# Patient Record
Sex: Female | Born: 1950 | Race: White | Hispanic: No | Marital: Married | Smoking: Never smoker
Health system: Southern US, Community
[De-identification: ages and names within clinical notes are randomized; demographics above are authoritative.]

## PROBLEM LIST (undated history)

## (undated) DIAGNOSIS — Z8701 Personal history of pneumonia (recurrent): Secondary | ICD-10-CM

## (undated) DIAGNOSIS — E119 Type 2 diabetes mellitus without complications: Secondary | ICD-10-CM

## (undated) DIAGNOSIS — Z87440 Personal history of urinary (tract) infections: Secondary | ICD-10-CM

## (undated) DIAGNOSIS — R933 Abnormal findings on diagnostic imaging of other parts of digestive tract: Secondary | ICD-10-CM

## (undated) DIAGNOSIS — Z0100 Encounter for examination of eyes and vision without abnormal findings: Secondary | ICD-10-CM

## (undated) DIAGNOSIS — Z789 Other specified health status: Secondary | ICD-10-CM

## (undated) DIAGNOSIS — Z9289 Personal history of other medical treatment: Secondary | ICD-10-CM

## (undated) DIAGNOSIS — T7840XA Allergy, unspecified, initial encounter: Secondary | ICD-10-CM

## (undated) DIAGNOSIS — I10 Essential (primary) hypertension: Secondary | ICD-10-CM

## (undated) HISTORY — DX: Personal history of urinary (tract) infections: Z87.440

## (undated) HISTORY — DX: Essential (primary) hypertension: I10

## (undated) HISTORY — DX: Personal history of other medical treatment: Z92.89

## (undated) HISTORY — DX: Allergy, unspecified, initial encounter: T78.40XA

## (undated) HISTORY — DX: Other specified health status: Z78.9

## (undated) HISTORY — DX: Encounter for examination of eyes and vision without abnormal findings: Z01.00

## (undated) HISTORY — DX: Abnormal findings on diagnostic imaging of other parts of digestive tract: R93.3

## (undated) HISTORY — DX: Personal history of pneumonia (recurrent): Z87.01

## (undated) HISTORY — DX: Type 2 diabetes mellitus without complications: E11.9

---

## 1997-09-21 ENCOUNTER — Other Ambulatory Visit: Admission: RE | Admit: 1997-09-21 | Discharge: 1997-09-21 | Payer: Self-pay | Admitting: Obstetrics & Gynecology

## 1998-09-02 ENCOUNTER — Other Ambulatory Visit: Admission: RE | Admit: 1998-09-02 | Discharge: 1998-09-02 | Payer: Self-pay | Admitting: Obstetrics & Gynecology

## 1999-02-25 ENCOUNTER — Other Ambulatory Visit: Admission: RE | Admit: 1999-02-25 | Discharge: 1999-02-25 | Payer: Self-pay | Admitting: Obstetrics and Gynecology

## 2000-02-21 ENCOUNTER — Other Ambulatory Visit: Admission: RE | Admit: 2000-02-21 | Discharge: 2000-02-21 | Payer: Self-pay | Admitting: Obstetrics and Gynecology

## 2000-05-31 ENCOUNTER — Ambulatory Visit (HOSPITAL_BASED_OUTPATIENT_CLINIC_OR_DEPARTMENT_OTHER): Admission: RE | Admit: 2000-05-31 | Discharge: 2000-05-31 | Payer: Self-pay | Admitting: Orthopedic Surgery

## 2000-08-03 ENCOUNTER — Ambulatory Visit (HOSPITAL_BASED_OUTPATIENT_CLINIC_OR_DEPARTMENT_OTHER): Admission: RE | Admit: 2000-08-03 | Discharge: 2000-08-03 | Payer: Self-pay | Admitting: Orthopedic Surgery

## 2002-06-12 HISTORY — PX: OTHER SURGICAL HISTORY: SHX169

## 2012-06-12 HISTORY — PX: BUNIONECTOMY: SHX129

## 2014-09-16 HISTORY — PX: ESOPHAGOGASTRODUODENOSCOPY: SHX1529

## 2015-02-22 DIAGNOSIS — R933 Abnormal findings on diagnostic imaging of other parts of digestive tract: Secondary | ICD-10-CM

## 2015-02-22 HISTORY — PX: COLONOSCOPY: SHX174

## 2015-02-22 HISTORY — DX: Abnormal findings on diagnostic imaging of other parts of digestive tract: R93.3

## 2015-10-10 DIAGNOSIS — E119 Type 2 diabetes mellitus without complications: Secondary | ICD-10-CM

## 2015-10-10 DIAGNOSIS — Z0100 Encounter for examination of eyes and vision without abnormal findings: Secondary | ICD-10-CM

## 2015-10-10 HISTORY — DX: Type 2 diabetes mellitus without complications: E11.9

## 2015-10-10 HISTORY — DX: Encounter for examination of eyes and vision without abnormal findings: Z01.00

## 2015-12-01 DIAGNOSIS — Z9289 Personal history of other medical treatment: Secondary | ICD-10-CM

## 2015-12-01 HISTORY — DX: Personal history of other medical treatment: Z92.89

## 2016-02-23 ENCOUNTER — Ambulatory Visit (INDEPENDENT_AMBULATORY_CARE_PROVIDER_SITE_OTHER): Payer: BLUE CROSS/BLUE SHIELD | Admitting: Sports Medicine

## 2016-02-23 ENCOUNTER — Ambulatory Visit (INDEPENDENT_AMBULATORY_CARE_PROVIDER_SITE_OTHER): Payer: BLUE CROSS/BLUE SHIELD

## 2016-02-23 ENCOUNTER — Encounter: Payer: Self-pay | Admitting: Sports Medicine

## 2016-02-23 ENCOUNTER — Encounter (INDEPENDENT_AMBULATORY_CARE_PROVIDER_SITE_OTHER): Payer: Self-pay

## 2016-02-23 VITALS — Ht 69.0 in | Wt 185.0 lb

## 2016-02-23 DIAGNOSIS — M76821 Posterior tibial tendinitis, right leg: Secondary | ICD-10-CM | POA: Diagnosis not present

## 2016-02-23 DIAGNOSIS — R609 Edema, unspecified: Secondary | ICD-10-CM | POA: Diagnosis not present

## 2016-02-23 DIAGNOSIS — M79671 Pain in right foot: Secondary | ICD-10-CM | POA: Diagnosis not present

## 2016-02-23 MED ORDER — MELOXICAM 15 MG PO TABS: 15.0000 mg | ORAL_TABLET | Freq: Every day | ORAL | 0 refills | Status: DC

## 2016-02-23 MED ORDER — METHYLPREDNISOLONE 4 MG PO TBPK: ORAL_TABLET | ORAL | 0 refills | Status: DC

## 2016-02-23 NOTE — Progress Notes (Signed)
Subjective: Mary Colon is a 65 y.o. female patient who presents to office for evaluation of right foot pain. Patient complains of progressive pain especially over the last 3 weeks in the right foot at the at foot and ankle. Reports pain x 3 weeks that started after trip to [place] where she did a lot of walking, states prior to trip was having some arch pain but not like what she experienced since. Patient has tried Tylenol, ace, ice, Biofreeze with no relief in symptoms. Pain 10/10. Patient denies any other pedal complaints.   There are no active problems to display for this patient.   No current outpatient prescriptions on file prior to visit.   No current facility-administered medications on file prior to visit.     Allergies  Allergen Reactions  . Codeine     Objective:  General: Alert and oriented x3 in no acute distress  Dermatology: No open lesions bilateral lower extremities, no webspace macerations, no ecchymosis bilateral, all nails x 10 are well manicured.  Vascular: Dorsalis Pedis and Posterior Tibial pedal pulses palpable, Capillary Fill Time 3 seconds,(+) pedal hair growth bilateral, mild edema focal to medial foot and ankle on right, Temperature gradient within normal limits.  Neurology: Gross sensation intact via light touch bilateral. (+ )Tinels sign right PT nerve.   Musculoskeletal: Moderate tenderness with palpation at right foot along PT tendon course with pain on eversion and dorsiflexion on right without weakness. Pes planus. No pain with calf compression bilateral.  Gait: Antalgic gait  Xrays  Right Foot   Impression: Normal osseous mineralization, no fracture, midfoot collapse with joint space narrowing, calcaneal spurs, and hammertoe. No foreign body, Mild soft tissue swelling all other soft tissue planes intact.   Assessment and Plan: Problem List Items Addressed This Visit    None    Visit Diagnoses    Right foot pain    -  Primary   Relevant  Medications   methylPREDNISolone (MEDROL DOSEPAK) 4 MG TBPK tablet   meloxicam (MOBIC) 15 MG tablet   Other Relevant Orders   DG Foot 2 Views Right   Posterior tibial tendonitis of right leg       Relevant Medications   methylPREDNISolone (MEDROL DOSEPAK) 4 MG TBPK tablet   meloxicam (MOBIC) 15 MG tablet   Swelling       Relevant Medications   methylPREDNISolone (MEDROL DOSEPAK) 4 MG TBPK tablet   meloxicam (MOBIC) 15 MG tablet      -Complete examination performed -Xrays reviewed -Discussed treatement options for possible acute tendonitis vs partial tear -Rx Medrol dosepak and Mobic to start once dose pak is completed -Teacher, English as a foreign language boot to keep intact for 5 days and Dispensed CAM boot to wear at all times until next visit -Recommend protection, ice, elevation, and rest -Patient to return to office in 2 weeks or sooner if condition worsens. If symptoms still present will order MRI r/o partial tear.   Landis Martins, DPM

## 2016-02-23 NOTE — Patient Instructions (Signed)

## 2016-02-25 ENCOUNTER — Telehealth: Payer: Self-pay | Admitting: Sports Medicine

## 2016-02-25 NOTE — Telephone Encounter (Signed)
Patient called stating that she started taking the prednisone dose pack. States that she has had this before in the past with no problems. However, on yesterday after taking 4 tablets. She began to get a rash with redness and swelling and itchiness. Thus stop taking the medication. I advised patient to discontinue medication and to pick up, Benadryl and hydrocortisone cream. Advised patient that if within 24 hours, Symptoms are not improved or if start to worsen where there is angioedema shortness of breath or worsening rash in symptoms to report to the emergency room immediately. Advised patient that I would document this as a adverse reaction. Encouraged patient to call again if there are any other problems. Patient follow up as scheduled.  -Dr. Cannon Kettle

## 2016-03-02 ENCOUNTER — Telehealth: Payer: Self-pay | Admitting: *Deleted

## 2016-03-02 NOTE — Telephone Encounter (Signed)
Pt states she is uncertain if she is to take the soft cast off, and her husband isn't helping. I reviewed 02/23/2016 office note and pt was able to take the unna boot off after 5 days.  I informed pt and she said she felt she needed it the extra time anyway. Pt states she would take it off now.

## 2016-03-08 ENCOUNTER — Encounter: Payer: Self-pay | Admitting: Sports Medicine

## 2016-03-08 ENCOUNTER — Ambulatory Visit (INDEPENDENT_AMBULATORY_CARE_PROVIDER_SITE_OTHER): Payer: BLUE CROSS/BLUE SHIELD | Admitting: Sports Medicine

## 2016-03-08 DIAGNOSIS — M79671 Pain in right foot: Secondary | ICD-10-CM | POA: Diagnosis not present

## 2016-03-08 DIAGNOSIS — M76821 Posterior tibial tendinitis, right leg: Secondary | ICD-10-CM

## 2016-03-08 NOTE — Progress Notes (Signed)
Subjective: Mary Colon is a 65 y.o. female patient who returns to office for follow up evaluation of right foot pain. Patient states that pain is better at right foot and ankle. States that the [place] boot helped; wore it for 8 days and she went 1 day without the boot and started having some pain. Reports overall pain is better but not 100% still has some tender points. Admits stopped medrol dose pack due to rash and took Benadryl as instructed with no issues. Patient denies any other pedal complaints.   There are no active problems to display for this patient.   Current Outpatient Prescriptions on File Prior to Visit  Medication Sig Dispense Refill  . fluconazole (DIFLUCAN) 200 MG tablet TK 1 T PO Q WEEK FOR 3 WEEKS  0  . FREESTYLE LITE test strip USE TO TEST BLOOD SUGAR BID  3  . meloxicam (MOBIC) 15 MG tablet Take 1 tablet (15 mg total) by mouth daily. With food 30 tablet 0  . methylPREDNISolone (MEDROL DOSEPAK) 4 MG TBPK tablet Take 1st as instructed 21 tablet 0  . traMADol (ULTRAM) 50 MG tablet TK 1 T PO Q 4-6 H  0   No current facility-administered medications on file prior to visit.     Allergies  Allergen Reactions  . Codeine   . Methylprednisolone Rash    Objective:  General: Alert and oriented x3 in no acute distress  Dermatology: No open lesions bilateral lower extremities, no webspace macerations, no ecchymosis bilateral, all nails x 10 are well manicured.  Vascular: Dorsalis Pedis and Posterior Tibial pedal pulses palpable, Capillary Fill Time 3 seconds,(+) pedal hair growth bilateral, mild edema focal to medial foot and ankle on right, Temperature gradient within normal limits.  Neurology: Gross sensation intact via light touch bilateral. (+ )Tinels sign right PT nerve.   Musculoskeletal: Mild tenderness with palpation at right foot along PT tendon course with pain on eversion and dorsiflexion on right without weakness. Pes planus. No pain with calf compression  bilateral.  Assessment and Plan: Problem List Items Addressed This Visit    None    Visit Diagnoses    Posterior tibial tendonitis of right leg    -  Primary   Right foot pain          -Complete examination performed -Discussed treatement options for tendonitis vs partial tear -Patient declined injection and wants to continue with conservative care and wants to think about MRI and will call office back in 1 week if she wants it to be performed -Dispensed compression anklet for edema control and support to right foot and ankle -Continue with CAM boot -Continue with Mobic until completed -Recommend protection, ice, elevation, and rest -Patient to return to office in 3 weeks or sooner if condition worsens.  Landis Martins, DPM

## 2016-03-29 ENCOUNTER — Encounter: Payer: Self-pay | Admitting: Sports Medicine

## 2016-03-29 ENCOUNTER — Ambulatory Visit (INDEPENDENT_AMBULATORY_CARE_PROVIDER_SITE_OTHER): Payer: BLUE CROSS/BLUE SHIELD | Admitting: Sports Medicine

## 2016-03-29 DIAGNOSIS — M729 Fibroblastic disorder, unspecified: Secondary | ICD-10-CM

## 2016-03-29 DIAGNOSIS — M2140 Flat foot [pes planus] (acquired), unspecified foot: Secondary | ICD-10-CM | POA: Diagnosis not present

## 2016-03-29 DIAGNOSIS — M76821 Posterior tibial tendinitis, right leg: Secondary | ICD-10-CM | POA: Diagnosis not present

## 2016-03-29 NOTE — Progress Notes (Signed)
Subjective: Mary Colon is a 65 y.o. female patient who returns to office for follow up evaluation of right foot pain. Patient states that pain is better at right foot and ankle. States that she has stopped wearing the boot and that sometimes is still feels like her arch is falling and that the bone at the inner portion of her foot is sticking out a little more. Reports overall pain is better but not 100% still has some tender points from time to time. Patient denies any other pedal complaints.   There are no active problems to display for this patient.   Current Outpatient Prescriptions on File Prior to Visit  Medication Sig Dispense Refill  . fluconazole (DIFLUCAN) 200 MG tablet TK 1 T PO Q WEEK FOR 3 WEEKS  0  . FREESTYLE LITE test strip USE TO TEST BLOOD SUGAR BID  3  . meloxicam (MOBIC) 15 MG tablet Take 1 tablet (15 mg total) by mouth daily. With food 30 tablet 0  . methylPREDNISolone (MEDROL DOSEPAK) 4 MG TBPK tablet Take 1st as instructed 21 tablet 0  . traMADol (ULTRAM) 50 MG tablet TK 1 T PO Q 4-6 H  0   No current facility-administered medications on file prior to visit.     Allergies  Allergen Reactions  . Codeine   . Methylprednisolone Rash    Objective:  General: Alert and oriented x3 in no acute distress  Dermatology: No open lesions bilateral lower extremities, no webspace macerations, no ecchymosis bilateral, all nails x 10 are well manicured.  Vascular: Dorsalis Pedis and Posterior Tibial pedal pulses palpable, Capillary Fill Time 3 seconds,(+) pedal hair growth bilateral, mild edema focal to medial foot and ankle on right, Temperature gradient within normal limits.  Neurology: Gross sensation intact via light touch bilateral. (+ )Tinels sign right PT nerve.   Musculoskeletal: Decreased tenderness with palpation at right foot along PT tendon course with pain on eversion and dorsiflexion on right without weakness. Pes planus. No pain with calf compression  bilateral.  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 Patient will be placed on casting schedule to be casted by [facility] for three quarter length orthotic with a heel post, and vinyl top cover. -Patient to return to office as needed or sooner if condition worsens.  Landis Martins, DPM

## 2016-03-30 ENCOUNTER — Telehealth: Payer: Self-pay | Admitting: Sports Medicine

## 2016-03-30 NOTE — Telephone Encounter (Signed)
Called patient re: orthotic coverage; req that she call the office.

## 2016-03-31 ENCOUNTER — Ambulatory Visit: Payer: BLUE CROSS/BLUE SHIELD | Admitting: Sports Medicine

## 2016-09-13 DIAGNOSIS — M5416 Radiculopathy, lumbar region: Secondary | ICD-10-CM | POA: Diagnosis not present

## 2016-09-13 DIAGNOSIS — M533 Sacrococcygeal disorders, not elsewhere classified: Secondary | ICD-10-CM | POA: Diagnosis not present

## 2016-09-29 DIAGNOSIS — R1013 Epigastric pain: Secondary | ICD-10-CM | POA: Diagnosis not present

## 2016-09-29 DIAGNOSIS — K219 Gastro-esophageal reflux disease without esophagitis: Secondary | ICD-10-CM | POA: Diagnosis not present

## 2016-10-18 DIAGNOSIS — M5416 Radiculopathy, lumbar region: Secondary | ICD-10-CM | POA: Diagnosis not present

## 2016-10-18 DIAGNOSIS — M533 Sacrococcygeal disorders, not elsewhere classified: Secondary | ICD-10-CM | POA: Diagnosis not present

## 2016-11-30 DIAGNOSIS — M5416 Radiculopathy, lumbar region: Secondary | ICD-10-CM | POA: Diagnosis not present

## 2016-12-18 ENCOUNTER — Emergency Department (HOSPITAL_COMMUNITY): Payer: PPO

## 2016-12-18 ENCOUNTER — Emergency Department (HOSPITAL_COMMUNITY)
Admission: EM | Admit: 2016-12-18 | Discharge: 2016-12-18 | Disposition: A | Discharge status: Home / Self Care | Payer: PPO | Attending: Physician Assistant | Admitting: Physician Assistant

## 2016-12-18 DIAGNOSIS — S098XXA Other specified injuries of head, initial encounter: Secondary | ICD-10-CM | POA: Diagnosis present

## 2016-12-18 DIAGNOSIS — W228XXA Striking against or struck by other objects, initial encounter: Secondary | ICD-10-CM | POA: Insufficient documentation

## 2016-12-18 DIAGNOSIS — S01511A Laceration without foreign body of lip, initial encounter: Secondary | ICD-10-CM | POA: Insufficient documentation

## 2016-12-18 DIAGNOSIS — Y999 Unspecified external cause status: Secondary | ICD-10-CM | POA: Insufficient documentation

## 2016-12-18 DIAGNOSIS — S0182XA Laceration with foreign body of other part of head, initial encounter: Secondary | ICD-10-CM | POA: Insufficient documentation

## 2016-12-18 DIAGNOSIS — Y929 Unspecified place or not applicable: Secondary | ICD-10-CM | POA: Diagnosis not present

## 2016-12-18 DIAGNOSIS — S4991XA Unspecified injury of right shoulder and upper arm, initial encounter: Secondary | ICD-10-CM | POA: Diagnosis not present

## 2016-12-18 DIAGNOSIS — Y939 Activity, unspecified: Secondary | ICD-10-CM | POA: Diagnosis not present

## 2016-12-18 DIAGNOSIS — S0181XA Laceration without foreign body of other part of head, initial encounter: Secondary | ICD-10-CM | POA: Diagnosis not present

## 2016-12-18 DIAGNOSIS — S022XXA Fracture of nasal bones, initial encounter for closed fracture: Secondary | ICD-10-CM | POA: Insufficient documentation

## 2016-12-18 DIAGNOSIS — M25511 Pain in right shoulder: Secondary | ICD-10-CM | POA: Diagnosis not present

## 2016-12-18 DIAGNOSIS — Z79899 Other long term (current) drug therapy: Secondary | ICD-10-CM | POA: Insufficient documentation

## 2016-12-18 DIAGNOSIS — W19XXXA Unspecified fall, initial encounter: Secondary | ICD-10-CM

## 2016-12-18 DIAGNOSIS — S0012XA Contusion of left eyelid and periocular area, initial encounter: Secondary | ICD-10-CM | POA: Diagnosis not present

## 2016-12-18 DIAGNOSIS — S0101XA Laceration without foreign body of scalp, initial encounter: Secondary | ICD-10-CM | POA: Diagnosis not present

## 2016-12-18 MED ORDER — TETANUS-DIPHTH-ACELL PERTUSSIS 5-2.5-18.5 LF-MCG/0.5 IM SUSP
0.5000 mL | Freq: Once | INTRAMUSCULAR | Status: AC
  Administered 2016-12-18: 0.5 mL via INTRAMUSCULAR
  Filled 2016-12-18: qty 0.5

## 2016-12-18 MED ORDER — OXYCODONE-ACETAMINOPHEN 5-325 MG PO TABS
2.0000 | ORAL_TABLET | Freq: Once | ORAL | Status: AC
  Administered 2016-12-18: 2 via ORAL

## 2016-12-18 MED ORDER — OXYCODONE-ACETAMINOPHEN 5-325 MG PO TABS
ORAL_TABLET | ORAL | Status: AC
  Filled 2016-12-18: qty 2

## 2016-12-18 MED ORDER — LIDOCAINE-EPINEPHRINE (PF) 2 %-1:200000 IJ SOLN
20.0000 mL | Freq: Once | INTRAMUSCULAR | Status: DC
  Filled 2016-12-18: qty 20

## 2016-12-18 NOTE — Discharge Instructions (Signed)
Keep wound and clean with mild soap and water and covered with a topical antibiotic ointment and bandage. Ice and elevate for additional pain relief, especially to areas of pain or swelling to the face. Alternate between Ibuprofen and Tylenol for additional pain relief. Follow up with the [facility] or your primary care physician in approximately 5 days for wound recheck and suture removal. Monitor for signs of infection to include but not limited to increasing pain, redness, drainage, or swelling. Return to emergency department for emergent changing or worsening symptoms. I have attached information for plastic surgery and ENT if you wish to follow-up with them in the future.

## 2016-12-18 NOTE — ED Provider Notes (Signed)
[facility] DEPT Provider Note   CSN: [id number] Arrival date & time: 12/18/16  1235  By signing my name below, I, Mayer Masker, attest that this documentation has been prepared under the direction and in the presence of Mackuen, Courteney Lyn, *. Electronically Signed: Mayer Masker, Scribe. 12/18/16. 3:15 PM.  History   Chief Complaint Chief Complaint  Patient presents with  . Fall   The history is provided by the patient. No language interpreter was used.   HPI Comments: Mary Colon is a 66 y.o. female who presents to the Emergency Department complaining of constant, rapid-onset pain to her forehead s/p a fall that occurred at 9:30am this morning. She has associated right-sided shoulder pain. She states she was picking up a picnic table that fell on top of her and injured her head, face, and right shoulder. She has a laceration to her forehead and lip and abrasions to her face. Bleeding was controlled with pressure and she applied ice to the affected areas. Pt ambulated normally after the accident. She went to urgent care where the wound was cleaned and was referred to come here for a CT scan and additional follow up. Patient was given percocet on arrival. She denies SOB, CP, numbness, tingling, weakness, abdominal pain, syncope/LOC, or visual disturbances. She denies blood thinner use. Tetanus is not UTD.  No past medical history on file.  There are no active problems to display for this patient.   No past surgical history on file.  OB History    No data available       Home Medications    Prior to Admission medications   Medication Sig Start Date End Date Taking? Authorizing Provider  fluconazole (DIFLUCAN) 200 MG tablet TK 1 T PO Q WEEK FOR 3 WEEKS 11/16/15   [provider]  FREESTYLE LITE test strip USE TO TEST BLOOD SUGAR BID 11/23/15   [provider]  meloxicam (MOBIC) 15 MG tablet Take 1 tablet (15 mg total) by mouth daily. With food 02/23/16   Landis Martins, DPM  methylPREDNISolone (MEDROL DOSEPAK) 4 MG TBPK tablet Take 1st as instructed 02/23/16   Landis Martins, DPM  traMADol (ULTRAM) 50 MG tablet TK 1 T PO Q 4-6 H 12/15/15   [provider]    Family History No family history on file.  Social History Social History  Substance Use Topics  . Smoking status: Unknown If Ever Smoked  . Smokeless tobacco: Not on file  . Alcohol use Not on file     Allergies   Codeine and Methylprednisolone   Review of Systems Review of Systems  Eyes: Negative for visual disturbance.  Respiratory: Negative for shortness of breath.   Cardiovascular: Negative for chest pain.  Gastrointestinal: Negative for abdominal pain.  Musculoskeletal: Positive for arthralgias.  Skin: Positive for wound.  Neurological: Negative for syncope.     Physical Exam Updated Vital Signs BP (!) 169/108 (BP Location: Left Arm)   Pulse 88   Temp 98.7 F (37.1 C) (Oral)   Resp 16   SpO2 97%   Physical Exam  Constitutional: She is oriented to person, place, and time. She appears well-developed and well-nourished. No distress.  HENT:  Head: Normocephalic.  6 cm V-shaped (3cm on either side) laceration to the middle of the forehead, bleeding is controlled. Tender to palpation. Left zygomatic arch tender to palpation overlying swelling and erythema. No deformity or crepitus noted. No battle signs. No tenderness palpation of the nose, dentition, or jaw. No sublingual  abnormalities or trismus. No dental injury noted. 1 cm laceration to the left lower lip involving the vermilion border.  Eyes: Conjunctivae and EOM are normal. Pupils are equal, round, and reactive to light. Right eye exhibits no discharge. Left eye exhibits no discharge.  Swelling and ecchymosis around the left eye. No pain with EOMs. No foreign bodies, conjunctival injection or hemorrhage noted.   Neck: Normal range of motion. Neck supple. No JVD present. No tracheal deviation present.    Cardiovascular: Normal rate, regular rhythm, normal heart sounds and intact distal pulses.   2+ radial and DP/PT pulses bl, negative Homan's bl   Pulmonary/Chest: Effort normal and breath sounds normal.  Abdominal: Soft. Bowel sounds are normal. She exhibits no distension.  Musculoskeletal: Normal range of motion. She exhibits tenderness. She exhibits no edema.  Right upper arm tender to palpation overlying abrasion. No deformity or crepitus noted. 5/5 strength of BUE and BLE major muscle groups. No midline spine TTP, no paraspinal muscle tenderness. No deformity, crepitus, or step-off noted.  Lymphadenopathy:    She has no cervical adenopathy.  Neurological: She is alert and oriented to person, place, and time. No cranial nerve deficit or sensory deficit.  Fluent speech, no facial droop, sensation intact to soft touch of extremities, normal gait. Cranial nerves III through XII tested and intact  Skin: Skin is warm and dry. Capillary refill takes less than 2 seconds. No erythema.  5cm x 3 cm superficial skin abrasion to the right shoulder extending down the right upper arm. Superficial lacerations and abrasions to the left side of the face, bleeding controlled.   Psychiatric: She has a normal mood and affect. Her behavior is normal.  Nursing note and vitals reviewed.    ED Treatments / Results  DIAGNOSTIC STUDIES: Oxygen Saturation is 97% on RA, normal by my interpretation.    COORDINATION OF CARE: 3:13 PM Discussed treatment plan with pt at bedside and pt agreed to plan.  Labs (all labs ordered are listed, but only abnormal results are displayed) Labs Reviewed - No data to display  EKG  EKG Interpretation None       Radiology Dg Shoulder Right  Result Date: 12/18/2016 CLINICAL DATA:  Status post fall right shoulder pain. EXAM: RIGHT SHOULDER - 2+ VIEW COMPARISON:  None. FINDINGS: There is no evidence of fracture or dislocation. There is no evidence of arthropathy or other  focal bone abnormality. Soft tissues are unremarkable. IMPRESSION: Negative. Electronically Signed   By: Kristine Garbe M.D.   On: 12/18/2016 16:16   Ct Head Wo Contrast  Result Date: 12/18/2016 CLINICAL DATA:  Status post fall.  Laceration of the forehead. EXAM: CT HEAD WITHOUT CONTRAST CT MAXILLOFACIAL WITHOUT CONTRAST TECHNIQUE: Multidetector CT imaging of the head and maxillofacial structures were performed using the standard protocol without intravenous contrast. Multiplanar CT image reconstructions of the maxillofacial structures were also generated. COMPARISON:  None. FINDINGS: CT HEAD FINDINGS Brain: No evidence of acute infarction, hemorrhage, hydrocephalus, extra-axial collection or mass lesion/mass effect. Vascular: No hyperdense vessel or unexpected calcification. Skull: No osseous abnormality. Other: Laceration of the forehead with soft tissue emphysema. CT MAXILLOFACIAL FINDINGS Osseous: Nondisplaced fracture of the right nasal bone with overlying soft tissue laceration and soft tissue emphysema. No other fracture or mandibular dislocation. No destructive process. 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 Orbits: Negative. No traumatic or inflammatory finding. Sinuses: Clear. Soft tissues: No soft tissue abnormality. IMPRESSION: 1. No acute  intracranial pathology. 2. Nondisplaced fracture of the right nasal bone with overlying soft tissue laceration and soft tissue emphysema. 3. Frontal scalp laceration. Electronically Signed   By: Kathreen Devoid   On: 12/18/2016 15:50   Dg Humerus Right  Result Date: 12/18/2016 CLINICAL DATA:  66 y/o F; status post fall with right shoulder pain. EXAM: RIGHT HUMERUS - 2+ VIEW COMPARISON:  None. FINDINGS: There is no evidence of fracture or other focal bone lesions. Soft tissues are unremarkable. IMPRESSION: Negative. Electronically Signed   By: Kristine Garbe M.D.   On:  12/18/2016 16:17   Ct Maxillofacial Wo Cm  Result Date: 12/18/2016 CLINICAL DATA:  Status post fall.  Laceration of the forehead. EXAM: CT HEAD WITHOUT CONTRAST CT MAXILLOFACIAL WITHOUT CONTRAST TECHNIQUE: Multidetector CT imaging of the head and maxillofacial structures were performed using the standard protocol without intravenous contrast. Multiplanar CT image reconstructions of the maxillofacial structures were also generated. COMPARISON:  None. FINDINGS: CT HEAD FINDINGS Brain: No evidence of acute infarction, hemorrhage, hydrocephalus, extra-axial collection or mass lesion/mass effect. Vascular: No hyperdense vessel or unexpected calcification. Skull: No osseous abnormality. Other: Laceration of the forehead with soft tissue emphysema. CT MAXILLOFACIAL FINDINGS Osseous: Nondisplaced fracture of the right nasal bone with overlying soft tissue laceration and soft tissue emphysema. No other fracture or mandibular dislocation. No destructive process. 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 Orbits: Negative. No traumatic or inflammatory finding. Sinuses: Clear. Soft tissues: No soft tissue abnormality. IMPRESSION: 1. No acute intracranial pathology. 2. Nondisplaced fracture of the right nasal bone with overlying soft tissue laceration and soft tissue emphysema. 3. Frontal scalp laceration. Electronically Signed   By: Kathreen Devoid   On: 12/18/2016 15:50    Procedures .Marland KitchenLaceration Repair Date/Time: 12/18/2016 6:22 PM Performed by: Rodell Perna A Authorized by: Rodell Perna A   Consent:    Consent obtained:  Verbal   Consent given by:  Patient   Risks discussed:  Infection, pain, poor cosmetic result, poor wound healing and need for additional repair Anesthesia (see MAR for exact dosages):    Anesthesia method:  Local infiltration   Local anesthetic:  Lidocaine 2% WITH epi Laceration details:    Location:  Scalp   Scalp  location:  Frontal   Length (cm):  6   Depth (mm):  6 Pre-procedure details:    Preparation:  Imaging obtained to evaluate for foreign bodies Exploration:    Hemostasis achieved with:  Direct pressure   Wound exploration: wound explored through full range of motion and entire depth of wound probed and visualized     Wound extent: areolar tissue violated, foreign bodies/material and muscle damage     Wound extent: no underlying fracture noted     Contaminated: yes   Treatment:    Area cleansed with:  Shur-Clens, Betadine and saline   Amount of cleaning:  Extensive   Irrigation solution:  Sterile saline   Irrigation method:  Pressure wash   Visualized foreign bodies/material removed: yes   Skin repair:    Repair method:  Sutures   Suture size:  5-0   Suture material:  Prolene   Suture technique:  Simple interrupted   Number of sutures:  9 Approximation:    Approximation:  Close   Vermilion border: well-aligned   Post-procedure details:    Dressing:  Antibiotic ointment and non-adherent dressing   Patient tolerance of procedure:  Tolerated well, no immediate complications .Marland KitchenLaceration Repair Date/Time:  12/18/2016 6:27 PM Performed by: Rodell Perna A Authorized by: Rodell Perna A   Consent:    Consent obtained:  Verbal   Consent given by:  Patient   Risks discussed:  Infection, pain and poor cosmetic result Anesthesia (see MAR for exact dosages):    Anesthesia method:  Local infiltration   Local anesthetic:  Lidocaine 2% WITH epi Laceration details:    Location:  Lip   Lip location:  Lower exterior lip   Length (cm):  1   Depth (mm):  4 Repair type:    Repair type:  Simple Pre-procedure details:    Preparation:  Imaging obtained to evaluate for foreign bodies and patient was prepped and draped in usual sterile fashion Exploration:    Hemostasis achieved with:  Direct pressure   Wound exploration: wound explored through full range of motion and entire depth of wound probed  and visualized     Wound extent: areolar tissue violated     Contaminated: no   Treatment:    Area cleansed with:  Shur-Clens and saline   Amount of cleaning:  Extensive   Irrigation solution:  Sterile water   Irrigation method:  Pressure wash Skin repair:    Repair method:  Sutures   Suture size:  5-0   Suture material:  Prolene   Suture technique:  Simple interrupted   Number of sutures:  3 Approximation:    Approximation:  Close   Vermilion border: well-aligned   Post-procedure details:    Dressing:  Open (no dressing)   Patient tolerance of procedure:  Tolerated well, no immediate complications   (including critical care time)  Medications Ordered in ED Medications  oxyCODONE-acetaminophen (PERCOCET/ROXICET) 5-325 MG per tablet (not administered)  lidocaine-EPINEPHrine (XYLOCAINE W/EPI) 2 %-1:200000 (PF) injection 20 mL (not administered)  oxyCODONE-acetaminophen (PERCOCET/ROXICET) 5-325 MG per tablet 2 tablet (2 tablets Oral Given 12/18/16 1301)  Tdap (BOOSTRIX) injection 0.5 mL (0.5 mLs Intramuscular Given 12/18/16 1534)     Initial Impression / Assessment and Plan / ED Course  I have reviewed the triage vital signs and the nursing notes.  Pertinent labs & imaging results that were available during my care of the patient were reviewed by me and considered in my medical decision making (see chart for details).      Patient presents with pain to the head in addition to lacerations to the forehead and left lower lip secondary to fall earlier today. Afebrile, initially hypertensive which I believe to be due to pain. No focal neurological deficits. Imaging shows nondisplaced right nasal bone fracture with overlying soft tissue emphysema. No evidence of nerve entrapment, SAH, ICH, or skull fracture. X-rays of the humerus and shoulder show no acute abnormality. Wounds extensively cleaned, lacerations repaired with simple interrupted sutures, 9 in the forehead and 3 at the lip.  Tetanus updated. Discussed wound care and indications for return to the ED with concerning signs or symptoms for infection. She will return to the ED in 5 days or present to her primary care office for suture removal. Pt verbalized understanding of and agreement with plan and is safe for discharge home at this time.  Final Clinical Impressions(s) / ED Diagnoses   Final diagnoses:  Fall, initial encounter  Laceration of forehead, initial encounter  Lip laceration, initial encounter  Closed nondisplaced fracture of nasal bone, initial encounter    New Prescriptions New Prescriptions   No medications on file  I personally performed the services described in this documentation, which was scribed  in my presence. The recorded information has been reviewed and is accurate.     Renita Papa, PA-C 12/18/16 1833    Macarthur Critchley, MD 12/18/16 2101

## 2016-12-18 NOTE — ED Triage Notes (Signed)
Pt arrives via POv from home with fall on picnic table this morning. Pt with large jagged laceration to forehead/bridge of nose. Bleeding controlled. Pt alert, oriented x4. C/o headache. Denies blood thinners. VSS. Last tetanus unknown.

## 2016-12-18 NOTE — ED Notes (Signed)
Patient transported to X-ray 

## 2016-12-24 ENCOUNTER — Emergency Department (HOSPITAL_COMMUNITY)
Admission: EM | Admit: 2016-12-24 | Discharge: 2016-12-24 | Disposition: A | Discharge status: Home / Self Care | Payer: PPO | Attending: Emergency Medicine | Admitting: Emergency Medicine

## 2016-12-24 ENCOUNTER — Encounter (HOSPITAL_COMMUNITY): Payer: Self-pay | Admitting: Emergency Medicine

## 2016-12-24 DIAGNOSIS — E109 Type 1 diabetes mellitus without complications: Secondary | ICD-10-CM | POA: Diagnosis not present

## 2016-12-24 DIAGNOSIS — Z4802 Encounter for removal of sutures: Secondary | ICD-10-CM

## 2016-12-24 DIAGNOSIS — S0181XD Laceration without foreign body of other part of head, subsequent encounter: Secondary | ICD-10-CM | POA: Insufficient documentation

## 2016-12-24 DIAGNOSIS — W228XXD Striking against or struck by other objects, subsequent encounter: Secondary | ICD-10-CM | POA: Insufficient documentation

## 2016-12-24 DIAGNOSIS — Z5189 Encounter for other specified aftercare: Secondary | ICD-10-CM | POA: Diagnosis not present

## 2016-12-24 DIAGNOSIS — Z885 Allergy status to narcotic agent status: Secondary | ICD-10-CM | POA: Insufficient documentation

## 2016-12-24 HISTORY — DX: Type 2 diabetes mellitus without complications: E11.9

## 2016-12-24 NOTE — ED Triage Notes (Signed)
Needs sutures out above nose placed  7/9 area welled healed no s/s  infection

## 2016-12-24 NOTE — ED Provider Notes (Signed)
[facility] DEPT Provider Note   CSN: [id number] Arrival date & time: 12/24/16  1453  By signing my name below, I, Ephriam Jenkins, attest that this documentation has been prepared under the direction and in the presence of No att. providers found. Electronically signed, Ephriam Jenkins, ED Scribe. 12/24/16. 3:29 PM.  History   Chief Complaint Chief Complaint  Patient presents with  . Suture / Staple Removal    HPI HPI Comments: Mary Colon is a 66 y.o. female who presents to the Emergency Department for suture removal today. Pt picked up a picnic table that fell on top of her and injured her face. She had 3 sutures placed to a small laceration to left lower lip involving the vermilion border and had 9 sutures placed to a V shaped laceration to the center of her forehead between her eyebrows. The injury was six days ago and was seen here immediately afterwards. Pt notes no difficulty with the wounds since the incident occurred and has been using proper care precautions. No further complaints at this time. No fevers.   The history is provided by the patient. No language interpreter was used.    Past Medical History:  Diagnosis Date  . Diabetes mellitus without complication ([place])     There are no active problems to display for this patient.   History reviewed. No pertinent surgical history.  OB History    No data available       Home Medications    Prior to Admission medications   Medication Sig Start Date End Date Taking? Authorizing Provider  fluconazole (DIFLUCAN) 200 MG tablet TK 1 T PO Q WEEK FOR 3 WEEKS 11/16/15   [provider]  FREESTYLE LITE test strip USE TO TEST BLOOD SUGAR BID 11/23/15   [provider]  meloxicam (MOBIC) 15 MG tablet Take 1 tablet (15 mg total) by mouth daily. With food 02/23/16   Landis Martins, DPM  methylPREDNISolone (MEDROL DOSEPAK) 4 MG TBPK tablet Take 1st as instructed 02/23/16   Landis Martins, DPM  traMADol (ULTRAM) 50 MG  tablet TK 1 T PO Q 4-6 H 12/15/15   [provider]    Family History No family history on file.  Social History Social History  Substance Use Topics  . Smoking status: Never Smoker  . Smokeless tobacco: Never Used  . Alcohol use Not on file     Allergies   Codeine and Methylprednisolone   Review of Systems Review of Systems  Constitutional: Negative for fever.  Skin: Positive for wound (well healing wounds with sutures placed).     Physical Exam Updated Vital Signs BP (!) 169/95   Pulse 97   Temp 98.3 F (36.8 C) (Oral)   Resp 18   SpO2 96%   Physical Exam  Constitutional: She is oriented to person, place, and time. She appears well-developed and well-nourished. No distress.  HENT:  Head: Normocephalic and atraumatic.  Neck: Normal range of motion.  Pulmonary/Chest: Effort normal.  Genitourinary:  Genitourinary Comments:    Neurological: She is alert and oriented to person, place, and time.  Skin: Skin is warm and dry. She is not diaphoretic. No erythema.  Right lower lip with 3 sutures, well healed, clean, dry, intact, no drainage, no erythema. Vermillion border seems to be intact.   Center of forehead with 9 sutures, well healed, clean, dry, intact, no drainage, no erythema. Vermillion border seems to be intact. Small amount of swelling  Psychiatric: She has a normal mood and  affect. Judgment normal.  Nursing note and vitals reviewed.    ED Treatments / Results  DIAGNOSTIC STUDIES: Oxygen Saturation is 96% on RA, normal by my interpretation.  COORDINATION OF CARE: 3:18 PM-Discussed treatment plan with pt at bedside and pt agreed to plan.   Labs (all labs ordered are listed, but only abnormal results are displayed) Labs Reviewed - No data to display  EKG  EKG Interpretation None       Radiology No results found.  Procedures .Suture Removal Date/Time: 12/24/2016 3:29 PM Performed by: Merrily Pew Authorized by: Merrily Pew    Consent:    Consent obtained:  Verbal   Consent given by:  Patient Location:    Location:  [place] location:  Forehead Procedure details:    Wound appearance:  No signs of infection, good wound healing and clean   Number of sutures removed:  9 Post-procedure details:    Patient tolerance of procedure:  Tolerated well, no immediate complications .Suture Removal Date/Time: 12/24/2016 3:30 PM Performed by: Merrily Pew Authorized by: Merrily Pew   Consent:    Consent obtained:  Verbal   Consent given by:  Patient Location:    Location:  Mouth   Mouth location:  Lower exterior lip Procedure details:    Wound appearance:  No signs of infection, good wound healing and clean   Number of sutures removed:  3 Post-procedure details:    Patient tolerance of procedure:  Tolerated well, no immediate complications   (including critical care time)  Medications Ordered in ED Medications - No data to display   Initial Impression / Assessment and Plan / ED Course  I have reviewed the triage vital signs and the nursing notes.  Pertinent labs & imaging results that were available during my care of the patient were reviewed by me and considered in my medical decision making (see chart for details).     No obvious infection. Wounds look good. Patient plans to see plastic surgery for consideration of scar revision.   Final Clinical Impressions(s) / ED Diagnoses   Final diagnoses:  Visit for suture removal  Visit for wound check    New Prescriptions Discharge Medication List as of 12/24/2016  3:31 PM     I personally performed the services described in this documentation, which was scribed in my presence. The recorded information has been reviewed and is accurate.    Merrily Pew, MD 12/24/16 2031

## 2017-01-04 DIAGNOSIS — S0181XA Laceration without foreign body of other part of head, initial encounter: Secondary | ICD-10-CM | POA: Insufficient documentation

## 2017-01-09 DIAGNOSIS — E7800 Pure hypercholesterolemia, unspecified: Secondary | ICD-10-CM | POA: Insufficient documentation

## 2017-01-09 DIAGNOSIS — E669 Obesity, unspecified: Secondary | ICD-10-CM | POA: Insufficient documentation

## 2017-01-09 DIAGNOSIS — I10 Essential (primary) hypertension: Secondary | ICD-10-CM | POA: Insufficient documentation

## 2017-01-11 DIAGNOSIS — Y999 Unspecified external cause status: Secondary | ICD-10-CM | POA: Diagnosis not present

## 2017-01-11 DIAGNOSIS — S0181XA Laceration without foreign body of other part of head, initial encounter: Secondary | ICD-10-CM | POA: Diagnosis not present

## 2017-03-20 DIAGNOSIS — Z789 Other specified health status: Secondary | ICD-10-CM

## 2017-03-20 DIAGNOSIS — E1129 Type 2 diabetes mellitus with other diabetic kidney complication: Secondary | ICD-10-CM | POA: Diagnosis not present

## 2017-03-20 DIAGNOSIS — Z0000 Encounter for general adult medical examination without abnormal findings: Secondary | ICD-10-CM | POA: Diagnosis not present

## 2017-03-20 HISTORY — DX: Other specified health status: Z78.9

## 2017-03-28 DIAGNOSIS — Z1331 Encounter for screening for depression: Secondary | ICD-10-CM | POA: Diagnosis not present

## 2017-03-28 DIAGNOSIS — Z9181 History of falling: Secondary | ICD-10-CM | POA: Diagnosis not present

## 2017-03-28 DIAGNOSIS — R809 Proteinuria, unspecified: Secondary | ICD-10-CM | POA: Diagnosis not present

## 2017-03-28 DIAGNOSIS — E1129 Type 2 diabetes mellitus with other diabetic kidney complication: Secondary | ICD-10-CM | POA: Diagnosis not present

## 2017-03-28 DIAGNOSIS — J309 Allergic rhinitis, unspecified: Secondary | ICD-10-CM | POA: Diagnosis not present

## 2017-03-28 DIAGNOSIS — Z0000 Encounter for general adult medical examination without abnormal findings: Secondary | ICD-10-CM | POA: Diagnosis not present

## 2017-03-28 DIAGNOSIS — E1165 Type 2 diabetes mellitus with hyperglycemia: Secondary | ICD-10-CM | POA: Diagnosis not present

## 2017-03-28 DIAGNOSIS — Z6832 Body mass index (BMI) 32.0-32.9, adult: Secondary | ICD-10-CM | POA: Diagnosis not present

## 2017-03-28 DIAGNOSIS — E7800 Pure hypercholesterolemia, unspecified: Secondary | ICD-10-CM | POA: Diagnosis not present

## 2017-04-03 DIAGNOSIS — M533 Sacrococcygeal disorders, not elsewhere classified: Secondary | ICD-10-CM | POA: Diagnosis not present

## 2017-04-03 DIAGNOSIS — M5416 Radiculopathy, lumbar region: Secondary | ICD-10-CM | POA: Diagnosis not present

## 2017-08-10 DIAGNOSIS — Z794 Long term (current) use of insulin: Secondary | ICD-10-CM | POA: Diagnosis not present

## 2017-08-10 DIAGNOSIS — K5900 Constipation, unspecified: Secondary | ICD-10-CM | POA: Diagnosis not present

## 2017-08-10 DIAGNOSIS — I10 Essential (primary) hypertension: Secondary | ICD-10-CM | POA: Diagnosis not present

## 2017-08-10 DIAGNOSIS — E7800 Pure hypercholesterolemia, unspecified: Secondary | ICD-10-CM | POA: Diagnosis not present

## 2017-08-10 DIAGNOSIS — Z6830 Body mass index (BMI) 30.0-30.9, adult: Secondary | ICD-10-CM | POA: Diagnosis not present

## 2017-08-10 DIAGNOSIS — R112 Nausea with vomiting, unspecified: Secondary | ICD-10-CM | POA: Diagnosis not present

## 2017-08-10 DIAGNOSIS — R109 Unspecified abdominal pain: Secondary | ICD-10-CM | POA: Diagnosis not present

## 2017-08-10 DIAGNOSIS — K5720 Diverticulitis of large intestine with perforation and abscess without bleeding: Secondary | ICD-10-CM | POA: Diagnosis not present

## 2017-08-10 DIAGNOSIS — K5732 Diverticulitis of large intestine without perforation or abscess without bleeding: Secondary | ICD-10-CM | POA: Diagnosis not present

## 2017-08-10 DIAGNOSIS — E1165 Type 2 diabetes mellitus with hyperglycemia: Secondary | ICD-10-CM | POA: Diagnosis not present

## 2017-08-10 DIAGNOSIS — E669 Obesity, unspecified: Secondary | ICD-10-CM | POA: Diagnosis not present

## 2017-08-10 DIAGNOSIS — E119 Type 2 diabetes mellitus without complications: Secondary | ICD-10-CM | POA: Insufficient documentation

## 2017-08-10 DIAGNOSIS — T402X5A Adverse effect of other opioids, initial encounter: Secondary | ICD-10-CM | POA: Diagnosis not present

## 2017-08-10 DIAGNOSIS — R509 Fever, unspecified: Secondary | ICD-10-CM | POA: Diagnosis not present

## 2017-08-10 DIAGNOSIS — Z791 Long term (current) use of non-steroidal anti-inflammatories (NSAID): Secondary | ICD-10-CM | POA: Diagnosis not present

## 2017-08-10 DIAGNOSIS — R1031 Right lower quadrant pain: Secondary | ICD-10-CM | POA: Diagnosis not present

## 2017-08-20 ENCOUNTER — Other Ambulatory Visit: Payer: Self-pay | Admitting: *Deleted

## 2017-08-20 NOTE — Patient Outreach (Signed)
[facility]) Care Management  08/20/2017  Mary Colon Apr 20, 1951 [id number]  Referral from [place] ; member discharged from inpatient admission from [facility] on 08/13/2017.  Telephone call to patient who was advised of reason for call. Patient states she would need to call [facility] customer services to verify referral & [facility] services.  States she"does not know anything about [facility] or anything about being referred to case management".  Patient was given call back number & toll free number for [facility] services to call back if she wants [facility] services.   Plan: Send contact information.. Send for case closure.   Sherrin Daisy, RN BSN [place] Management Coordinator [facility] Care Management  [phone]

## 2017-08-21 ENCOUNTER — Encounter: Payer: Self-pay | Admitting: *Deleted

## 2017-08-27 DIAGNOSIS — K5740 Diverticulitis of both small and large intestine with perforation and abscess without bleeding: Secondary | ICD-10-CM | POA: Diagnosis not present

## 2017-10-10 ENCOUNTER — Other Ambulatory Visit: Payer: Self-pay

## 2017-10-18 ENCOUNTER — Encounter: Payer: Self-pay | Admitting: *Deleted

## 2017-10-18 ENCOUNTER — Other Ambulatory Visit: Payer: Self-pay | Admitting: *Deleted
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
## 2017-10-18 NOTE — Patient Outreach (Signed)
[facility] [facility]) Care Management  10/18/2017  Vianca Bracher 1951-01-07 [id number]  Spoke with member by home phone and she requested return call in 15 minutes to complete initial [employer] Chronic Care Improvement Program assessment.  Barrington Ellison RN,CCM,CDE [place] Management Coordinator Office Phone [phone] Office Fax [phone]

## 2017-10-23 ENCOUNTER — Encounter: Payer: Self-pay | Admitting: *Deleted

## 2017-11-02 ENCOUNTER — Telehealth: Payer: Self-pay | Admitting: Gastroenterology

## 2017-11-02 ENCOUNTER — Other Ambulatory Visit: Payer: Self-pay | Admitting: Gastroenterology

## 2017-11-02 MED ORDER — PANTOPRAZOLE SODIUM 40 MG PO TBEC: 40.0000 mg | DELAYED_RELEASE_TABLET | Freq: Every day | ORAL | 0 refills | Status: DC

## 2017-11-02 NOTE — Telephone Encounter (Signed)
Sent 30 day supply to patients pharmacy.

## 2017-11-16 ENCOUNTER — Other Ambulatory Visit: Payer: Self-pay | Admitting: *Deleted

## 2017-11-16 ENCOUNTER — Ambulatory Visit: Payer: Self-pay | Admitting: *Deleted

## 2017-11-16 NOTE — Patient Outreach (Signed)
[place] [facility]) Care Management  11/16/2017  Mary Colon 02/25/1951 [id number]   Reached [name] at her home number for monthly follow up for  [employer] Chronic Care Improvement Program for chronic disease self management assistance. She states this is not a good time for the call and requests the call be rescheduled; rescheduled the call  for 1:30 p.m. on 6/11.  Barrington Ellison RN,CCM,CDE [place] Management Coordinator Office Phone [phone] Office Fax [phone]

## 2017-11-20 ENCOUNTER — Other Ambulatory Visit: Payer: Self-pay | Admitting: *Deleted

## 2017-11-20 ENCOUNTER — Encounter: Payer: Self-pay | Admitting: *Deleted

## 2017-11-20 NOTE — Patient Outreach (Signed)
[facility]) Care Management  [place]  11/20/2017   Mary Colon 06-23-1950 [id number]   First follow up call for this [employer] diabetes quality program member for self management assistance for chronic disease states of hypertension, hyperlipidemia and Type II diabetes.  Subjective: Mary Colon states she has not had time to read the information she was sent last month by this RNCM and she says she has not had time to view the [facility] education modules that were assigned to her. She states again she wants to try to control her blood pressure, cholesterol and diabetes through lifestyle changes but would agree to take medication if she is not successful.     Encounter Medications:  Outpatient Encounter Medications as of 10/18/2017  Medication Sig Note  . Biotin 1000 MCG tablet Take by mouth.   Marland Kitchen FREESTYLE LITE test strip USE TO TEST BLOOD SUGAR BID 02/23/2016: Received from: External Pharmacy  . glucosamine-chondroitin 500-400 MG tablet Take by mouth.   . loratadine (CLARITIN) 10 MG tablet loratadine 10 mg tablet  TK 1 T PO D   . meloxicam (MOBIC) 15 MG tablet Take 1 tablet (15 mg total) by mouth daily. With food 10/18/2017: Takes prn  . pantoprazole (PROTONIX) 40 MG tablet Take by mouth.   Marland Kitchen omeprazole (PRILOSEC) 20 MG capsule Take 20 mg by mouth daily.    No facility-administered encounter medications on file as of 10/18/2017.     Functional Status:  In your present state of health, do you have any difficulty performing the following activities: 11/20/2017 10/18/2017  Hearing? N N  Vision? N Y  Comment - contacts  Difficulty concentrating or making decisions? N N  Walking or climbing stairs? N N  Dressing or bathing? N N  Doing errands, shopping? N N  Preparing Food and eating ? N N  Using the Toilet? N N  In the past six months, have you accidently leaked urine? N N  Do you have problems with loss of bowel control? N N  Managing your Medications? N N   Managing your Finances? N N  Housekeeping or managing your Housekeeping? N N  Some recent data might be hidden    Fall/Depression Screening: Fall Risk  11/20/2017  Falls in the past year? No   PHQ 2/9 Scores 11/20/2017 10/18/2017  PHQ - 2 Score 0 0    Assessment: Mary Colon is enrolled in the [employer] Chronic Care Improvement Program and requires ongoing self management assistance with chronic disease states to meet treatment goals for hypertension, hyperlipidemia and Type II diabetes.  Regarding self management of hr diverticulitis with microperforation- she states she did not tolerated the Metamucil her surgeon ordered to help with increasing fiber in her diet because it constipated her so she takes Senokot daily instead. She says she does have an appointment with her gastroenterologist in June to schedule a colonoscopy before she sees her surgeon again in August. Regarding diabetes management, she states she used to do Yoga and walk 1 mile every day with her husband. She said she still wants to investigate the [employer] program [place]  Regarding lipid management she states she does not drink alcohol and drinks only unsweetened tea or water,   Plan:  [facility] CM Care Plan Problem One     Most Recent Value  Care Plan Problem One  Knowledge deficit  related to chronic disease management of Type II DM, HTN, hyperlipidemia and diverticulosis with recent hospitalization secondary  to acute diverticulitis with microperforation  Role Documenting the Problem One  Care Management Coordinator  Care Plan for Problem One  Active  THN Long Term Goal   In the next 90 days, member will verbalize increased understanding of how to improve management of Type II DM, hyperlipidemia and diverticulosis  THN Long Term Goal Start Date  10/18/17  Interventions for Problem One Long Term Goal  Identified gaps in knowledge related to chronic disease states and discussed strategies to improve  management, reviewed specific instructions from general surgeon to decrease risk of  future microperforations including high fiber diet, no fried foods, lean meats,increase consumption of fruits and vegetables, daily Metamucil- (did not tolerated because it caused constipation) and schedule colonoscopy for June or July of this year, reviewed her lipid profile results of 03/20/17 and discussed strategies specific to reducing triglycerides such as limiting concentrated sweets and avoiding overeating and strategies to reduce     total cholesterol such as avoiding fried foods and high fat meats and trans fats found in processed and packaged foods,  again requested she read the hyperlipidemia information mailed to her on prior to next month's call  THN CM Short Term Goal #1  In the next 30 days,  member will verbalize strategies to improve blood sugar via exercise and will report exercising for at least 15 minutes once weekly  THN CM Short Term Goal #1 Start Date  11/20/17  Interventions for Short Term Goal #1  discussed the positive effect of exercise on blood sugar control and mailed her information on how exercise improves blood sugar and recommended amount of exercise per week, Emmi e-mail on Type 2 diabetes: "Food and Exercise"  THN CM Short Term Goal #2  In the next 30 days,  member will verbalize strategies to improve blood sugars via meal planning   THN CM Short Term Goal #2 Start Date  11/20/17  Interventions for Short Term Goal #2 Reviewed the plate method to assist with controlling carbohydrates, again requested that she keep a food journal to review at next month's call, reviewed her A1C value of 8.5% on 08/10/17 and discussed strategies to get to target of <7.0% via exercise and meal planning, reviewed the american Diabetes association recommendation of goal of 150 minutes of exercise per week with 2 sessions of strength training and exercise to strengthen core and improve balance such a Yoga or Tai Chi,  emailed her information on [employer]ne[employer] and encouraged her to investigate the program     At [facility] request, will not fax today's note to her most recent provider a she is in the process of establishing with another provider. RNCM will contact member by phone monthly and as needed to assist with chronic disease self-management and assess member's progress toward mutually set goals.   Barrington Ellison RN,CCM,CDE [place] Management Coordinator Office Phone [phone] Office Fax [phone]

## 2017-12-03 ENCOUNTER — Other Ambulatory Visit: Payer: Self-pay | Admitting: Gastroenterology

## 2017-12-03 ENCOUNTER — Encounter: Payer: Self-pay | Admitting: Gastroenterology

## 2017-12-04 ENCOUNTER — Ambulatory Visit (INDEPENDENT_AMBULATORY_CARE_PROVIDER_SITE_OTHER): Payer: PPO | Admitting: Gastroenterology

## 2017-12-04 ENCOUNTER — Encounter: Payer: Self-pay | Admitting: Gastroenterology

## 2017-12-04 VITALS — BP 140/88 | HR 80 | Ht 66.0 in | Wt 194.1 lb

## 2017-12-04 DIAGNOSIS — D126 Benign neoplasm of colon, unspecified: Secondary | ICD-10-CM | POA: Diagnosis not present

## 2017-12-04 DIAGNOSIS — Z8719 Personal history of other diseases of the digestive system: Secondary | ICD-10-CM

## 2017-12-04 MED ORDER — SOD PICOSULFATE-MAG OX-CIT ACD 10-3.5-12 MG-GM -GM/160ML PO SOLN: 1.0000 | ORAL | 0 refills | Status: DC

## 2017-12-04 MED ORDER — PANTOPRAZOLE SODIUM 40 MG PO TBEC: 40.0000 mg | DELAYED_RELEASE_TABLET | Freq: Every day | ORAL | 3 refills | Status: DC

## 2017-12-04 NOTE — Patient Instructions (Signed)
If you are age 67 or older, your body mass index should be between 23-30. Your Body mass index is 31.33 kg/m. If this is out of the aforementioned range listed, please consider follow up with your Primary Care Provider.  If you are age 40 or younger, your body mass index should be between 19-25. Your Body mass index is 31.33 kg/m. If this is out of the aformentioned range listed, please consider follow up with your Primary Care Provider.   You have been scheduled for a colonoscopy. Please follow written instructions given to you at your visit today.  Please pick up your prep supplies at the pharmacy within the next 1-3 days. If you use inhalers (even only as needed), please bring them with you on the day of your procedure. Your physician has requested that you go to www.startemmi.com and enter the access code given to you at your visit today. This web site gives a general overview about your procedure. However, you should still follow specific instructions given to you by our office regarding your preparation for the procedure.  Thank you,  Dr. Jackquline Denmark

## 2017-12-04 NOTE — Progress Notes (Signed)
Chief Complaint: FU from hospitalizations at [facility] for diverticular abscess.  Referring Provider:  Dr Demetrius [facility] ([facility])    ASSESSMENT AND PLAN;   #1. H/O Diverticular abscess (08/2017, Resolved) - First episode of acute diverticulitis, complicated by 2.7 cm, managed conservatively. - Proceed with colonoscopy.  I have discussed the risks and benefits.  The risks including risk of perforation requiring laparotomy, bleeding after polypectomy requiring blood transfusions and risks of anesthesia/sedation were discussed.  Rare risks of missing colorectal neoplasms were also discussed.  Alternatives were given.  Patient is fully aware and agrees to proceed. All the questions were answered. Colonoscopy will be scheduled in upcoming days.  Patient is to report immediately if there is any significant weight loss or excessive bleeding until then. Consent forms were given for review. -Continue senna 3/day for now.  Will add MiraLAX after the colonoscopy. -Patient will follow-up with Dr Demetrius Revel ([facility]) thereafter.  #2. H/O colonic polyps-tubular adenomas    HPI:    Mary Colon is a 67 y.o. female  FU from hospitalization to [facility] Admitted August 10, 2017 at [facility] with acute sigmoid diverticulitis complicated by 2.7 cm diverticular abscess, managed conservatively with IV antibiotics.  She is followed by surgery.  She has been advised to get a repeat colonoscopy performed.  She feels much better.  She has been taking laxatives including senna 3/day, high-fiber diet.  No further constipation.  No melena or hematochezia.  No further abdominal pain.  Past GI procedures: -Colonoscopy 02/22/2015- ([facility])6 mm tubular adenoma, moderate sigmoid diverticulosis -EGD 09/16/2014-mild gastritis with negative CLOtest, negative small bowel biopsies.   Past Medical History:  Diagnosis Date  . Abnormal colonoscopy 02/22/2015  . Diabetes mellitus without  complication ([place])   . Diabetic eye exam ([place]) 10/10/2015   no retinopathy  . H/O mammogram 12/01/2015   normal  . History of pneumonia   . History of UTI   . Hypertension   . Microalbuminuria absent 03/20/2017    Past Surgical History:  Procedure Laterality Date  . COLONOSCOPY  02/22/2015   Colonic polyp status post polypectomy. Moderate predominantly sigmoid diverticulosis. Tubular adenoma.   . ESOPHAGOGASTRODUODENOSCOPY  09/16/2014   Mild gastrtis.     Family History  Problem Relation Age of Onset  . Cancer Sister        myoxid lipo sarcoma    Social History   Tobacco Use  . Smoking status: Never Smoker  . Smokeless tobacco: Never Used  Substance Use Topics  . Alcohol use: Never    Frequency: Never  . Drug use: Never    Current Outpatient Medications  Medication Sig Dispense Refill  . Biotin 1000 MCG tablet Take by mouth.    Marland Kitchen FREESTYLE LITE test strip USE TO TEST BLOOD SUGAR BID  3  . glucosamine-chondroitin 500-400 MG tablet Take by mouth.    . loratadine (CLARITIN) 10 MG tablet loratadine 10 mg tablet  TK 1 T PO D    . meloxicam (MOBIC) 15 MG tablet Take 1 tablet (15 mg total) by mouth daily. With food 30 tablet 0  . sennosides-docusate sodium (SENOKOT-S) 8.6-50 MG tablet Take 1 tablet by mouth daily.     No current facility-administered medications for this visit.     Allergies  Allergen Reactions  . Codeine   . Hydrocodone Nausea And Vomiting  . Oxycodone Nausea And Vomiting  . Methylprednisolone Rash    Review of Systems:  Constitutional: Denies fever, chills, diaphoresis,  appetite change and fatigue.  HEENT: Denies photophobia, eye pain, redness, hearing loss, ear pain, congestion, sore throat, rhinorrhea, sneezing, mouth sores, neck pain, neck stiffness and tinnitus.   Respiratory: Denies SOB, DOE, cough, chest tightness,  and wheezing.   Cardiovascular: Denies chest pain, palpitations and leg swelling.  Genitourinary: Denies dysuria, urgency,  frequency, hematuria, flank pain and difficulty urinating.  Musculoskeletal: Denies myalgias, back pain, joint swelling, arthralgias and gait problem.  Skin: No rash.  Neurological: Denies dizziness, seizures, syncope, weakness, light-headedness, numbness and headaches.  Hematological: Denies adenopathy. Easy bruising, personal or family bleeding history  Psychiatric/Behavioral: No anxiety or depression     Physical Exam:    BP 140/88   Pulse 80   Ht 5\' 6"  (1.676 m)   Wt 194 lb 2 oz (88.1 kg)   BMI 31.33 kg/m  Filed Weights   12/04/17 1006  Weight: 194 lb 2 oz (88.1 kg)   Constitutional:  Well-developed, in no acute distress. Psychiatric: Normal mood and affect. Behavior is normal. HEENT: Pupils normal.  Conjunctivae are normal. No scleral icterus. Neck supple.  Cardiovascular: Normal rate, regular rhythm. No edema Pulmonary/chest: Effort normal and breath sounds normal. No wheezing, rales or rhonchi. Abdominal: Soft, nondistended. Nontender. Bowel sounds active throughout. There are no masses palpable. No hepatomegaly. Rectal:  defered Neurological: Alert and oriented to person place and time. Skin: Skin is warm and dry. No rashes noted.  Notes from [facility] were reviewed.  Discussed with patient's husband.  Patient was seen in presence of patient's husband.   Carmell Austria, MD 12/04/2017, 10:14 AM  Cc: Dr Demetrius Revel ([facility])

## 2017-12-07 ENCOUNTER — Encounter: Payer: Self-pay | Admitting: Gastroenterology

## 2017-12-10 ENCOUNTER — Telehealth: Payer: Self-pay

## 2017-12-10 NOTE — Telephone Encounter (Signed)
Called patient and she will come by the [facility] office tomorrow to pick up sample.

## 2017-12-20 ENCOUNTER — Encounter: Payer: Self-pay | Admitting: Gastroenterology

## 2017-12-20 ENCOUNTER — Ambulatory Visit (AMBULATORY_SURGERY_CENTER): Payer: PPO | Admitting: Gastroenterology

## 2017-12-20 VITALS — BP 122/81 | HR 71 | Temp 98.7°F | Resp 15 | Ht 66.0 in | Wt 191.0 lb

## 2017-12-20 DIAGNOSIS — Z8719 Personal history of other diseases of the digestive system: Secondary | ICD-10-CM

## 2017-12-20 DIAGNOSIS — E119 Type 2 diabetes mellitus without complications: Secondary | ICD-10-CM | POA: Diagnosis not present

## 2017-12-20 DIAGNOSIS — Z86010 Personal history of colonic polyps: Secondary | ICD-10-CM

## 2017-12-20 DIAGNOSIS — K219 Gastro-esophageal reflux disease without esophagitis: Secondary | ICD-10-CM | POA: Diagnosis not present

## 2017-12-20 MED ORDER — SODIUM CHLORIDE 0.9 % IV SOLN: 500.0000 mL | Freq: Once | INTRAVENOUS | Status: DC

## 2017-12-20 NOTE — Patient Instructions (Signed)
YOU HAD AN ENDOSCOPIC PROCEDURE TODAY AT THE [facility]:   Refer to the procedure report that was given to you for any specific questions about what was found during the examination.  If the procedure report does not answer your questions, please call your gastroenterologist to clarify.  If you requested that your care partner not be given the details of your procedure findings, then the procedure report has been included in a sealed envelope for you to review at your convenience later.  YOU SHOULD EXPECT: Some feelings of bloating in the abdomen. Passage of more gas than usual.  Walking can help get rid of the air that was put into your GI tract during the procedure and reduce the bloating. If you had a lower endoscopy (such as a colonoscopy or flexible sigmoidoscopy) you may notice spotting of blood in your stool or on the toilet paper. If you underwent a bowel prep for your procedure, you may not have a normal bowel movement for a few days.  Please Note:  You might notice some irritation and congestion in your nose or some drainage.  This is from the oxygen used during your procedure.  There is no need for concern and it should clear up in a day or so.  SYMPTOMS TO REPORT IMMEDIATELY:   Following lower endoscopy (colonoscopy or flexible sigmoidoscopy):  Excessive amounts of blood in the stool  Significant tenderness or worsening of abdominal pains  Swelling of the abdomen that is new, acute  Fever of 100F or higher  For urgent or emergent issues, a gastroenterologist can be reached at any hour by calling [phone].   DIET:  We do recommend a small meal at first, but then you may proceed to your regular diet.  Drink plenty of fluids but you should avoid alcoholic beverages for 24 hours.  MEDICATIONS: Continue present medications.  Please see handouts given to you by your recovery nurse.  ACTIVITY:  You should plan to take it easy for the rest of today and you should NOT  DRIVE or use heavy machinery until tomorrow (because of the sedation medicines used during the test).    FOLLOW UP: Our staff will call the number listed on your records the next business day following your procedure to check on you and address any questions or concerns that you may have regarding the information given to you following your procedure. If we do not reach you, we will leave a message.  However, if you are feeling well and you are not experiencing any problems, there is no need to return our call.  We will assume that you have returned to your regular daily activities without incident.  If any biopsies were taken you will be contacted by phone or by letter within the next 1-3 weeks.  Please call us at [phone] if you have not heard about the biopsies in 3 weeks.   Thank you for allowing us to provide for your healthcare needs today.   SIGNATURES/CONFIDENTIALITY: You and/or your care partner have signed paperwork which will be entered into your electronic medical record.  These signatures attest to the fact that that the information above on your After Visit Summary has been reviewed and is understood.  Full responsibility of the confidentiality of this discharge information lies with you and/or your care-partner. 

## 2017-12-20 NOTE — Op Note (Signed)
[place] Patient Name: Mary Colon Procedure Date: 12/20/2017 10:22 AM MRN: [id number] Endoscopist: Jackquline Denmark , MD Age: 67 Referring MD:  Date of Birth: [birth year] Gender: Female Account #: [id number] Procedure:                Colonoscopy Indications:              High risk colon cancer surveillance: Personal                            history of colonic polyps. H/O Diverticular abscess                            (08/2017, Resolved) Medicines:                Monitored Anesthesia Care Procedure:                Pre-Anesthesia Assessment:                           - Prior to the procedure, a History and Physical                            was performed, and patient medications and                            allergies were reviewed. The patient is competent.                            The risks and benefits of the procedure and the                            sedation options and risks were discussed with the                            patient. All questions were answered and informed                            consent was obtained. Patient identification and                            proposed procedure were verified by the physician                            in the procedure room. Mental Status Examination:                            alert and oriented. Prophylactic Antibiotics: The                            patient does not require prophylactic antibiotics.                            Prior Anticoagulants: The patient has taken no  previous anticoagulant or antiplatelet agents. ASA                            Grade Assessment: II - A patient with mild systemic                            disease. After reviewing the risks and benefits,                            the patient was deemed in satisfactory condition to                            undergo the procedure. The anesthesia plan was to                            use monitored anesthesia care  (MAC). Immediately                            prior to administration of medications, the patient                            was re-assessed for adequacy to receive sedatives.                            The heart rate, respiratory rate, oxygen                            saturations, blood pressure, adequacy of pulmonary                            ventilation, and response to care were monitored                            throughout the procedure. The physical status of                            the patient was re-assessed after the procedure.                           After obtaining informed consent, the colonoscope                            was passed under direct vision. Throughout the                            procedure, the patient's blood pressure, pulse, and                            oxygen saturations were monitored continuously. The                            Colonoscope was introduced through the anus and  advanced to the 2 cm into the ileum. The                            colonoscopy was performed without difficulty. The                            patient tolerated the procedure well. The quality                            of the bowel preparation was good. Scope In: 10:29:20 AM Scope Out: 10:45:44 AM Scope Withdrawal Time: 0 hours 9 minutes 44 seconds  Total Procedure Duration: 0 hours 16 minutes 24 seconds  Findings:                 The perianal and digital rectal examinations were                            normal.                           Multiple small-mouthed diverticula were found in                            the sigmoid colon. Very rare (1-2) small                            diverticula in the ascending colon.                           Non-bleeding internal hemorrhoids were found during                            retroflexion. The hemorrhoids were small.                           The exam was otherwise without abnormality on                             direct and retroflexion views. Complications:            No immediate complications. Estimated Blood Loss:     Estimated blood loss: none. Impression:               - Moderate sigmoid diverticulosis.                           - Small internal hemorrhoids.                           - Otherwise normal colonoscopy to TI. Recommendation:           - Patient has a contact number available for                            emergencies. The signs and symptoms of potential  delayed complications were discussed with the                            patient. Return to normal activities tomorrow.                            Written discharge instructions were provided to the                            patient.                           - High fiber diet.                           - Continue present medications.                           - Return to GI office PRN.                           - Recommend repeating screening colonoscopy in 5                            years due to personal history of tubular adenomas.                            Certainly, earlier if she starts having any new                            problems. Jackquline Denmark, MD 12/20/2017 10:54:07 AM This report has been signed electronically.

## 2017-12-20 NOTE — Progress Notes (Signed)
Pt's states no medical or surgical changes since previsit or office visit. 

## 2017-12-20 NOTE — Progress Notes (Signed)
Report to PACU, RN, vss, BBS= Clear.  

## 2017-12-21 ENCOUNTER — Telehealth: Payer: Self-pay

## 2017-12-21 NOTE — Telephone Encounter (Signed)
  Follow up Call-  Call back number 12/20/2017  Post procedure Call Back phone  # [phone]  Permission to leave phone message Yes  Some recent data might be hidden     Patient questions:  Do you have a fever, pain , or abdominal swelling? No. Pain Score  0 *  Have you tolerated food without any problems? Yes.    Have you been able to return to your normal activities? Yes.    Do you have any questions about your discharge instructions: Diet   No. Medications  No. Follow up visit  No.  Do you have questions or concerns about your Care? No.  Actions: * If pain score is 4 or above: No action needed, pain <4.

## 2017-12-25 ENCOUNTER — Other Ambulatory Visit: Payer: Self-pay | Admitting: *Deleted

## 2017-12-25 ENCOUNTER — Ambulatory Visit: Payer: Self-pay | Admitting: *Deleted

## 2017-12-25 NOTE — Patient Outreach (Signed)
[facility]) Care Management  12/25/2017  Tea Hjort Feb 04, 1951 [id number]   Attempted monthly scheduled follow up call for this [employer] diabetes quality program member for self management assistance for chronic disease states of hypertension, hyperlipidemia and Type II diabetes and diverticulosis.  Left message on home number requesting return call.   Barrington Ellison RN,CCM,CDE [place] Management Coordinator Office Phone [phone] Office Fax [phone]

## 2017-12-28 ENCOUNTER — Other Ambulatory Visit: Payer: Self-pay | Admitting: *Deleted

## 2017-12-28 ENCOUNTER — Ambulatory Visit: Payer: Self-pay | Admitting: *Deleted

## 2017-12-28 NOTE — Patient Outreach (Signed)
[facility] [facility]) Care Management  12/28/2017  Anh Harwood 24-Nov-1950 [id number]   Second attempt this week to complete monthly scheduled follow up call for this [employer] [place] member for self management assistance for chronic disease states of hypertension, hyperlipidemia and Type II diabetes and diverticulosis.  Left message on home number requesting return call.    Barrington Ellison RN,CCM,CDE [place] Management Coordinator Office Phone [phone] Office Fax [phone]

## 2018-01-01 ENCOUNTER — Ambulatory Visit: Payer: Self-pay | Admitting: *Deleted

## 2018-01-04 ENCOUNTER — Ambulatory Visit: Payer: Self-pay | Admitting: *Deleted

## 2018-01-08 ENCOUNTER — Other Ambulatory Visit: Payer: Self-pay | Admitting: *Deleted

## 2018-01-08 ENCOUNTER — Ambulatory Visit: Payer: Self-pay | Admitting: *Deleted

## 2018-01-08 NOTE — Patient Outreach (Signed)
[facility] [facility]) Care Management  [place]  01/08/2018   Mary Colon Jan 23, 1951 [id number]  Subjective: Spoke with Auriella via phone, states she is pushed for time to talk, received all the information this [facility] mailed and emailed her and voiced appreciation for the information. State she is doing well but has not yet established a primary care provider.  States her annual wellness exam was in early October of 2018.   Objective: Electronic medical record indicates colonoscopy done on 12/20/17 showed non bleeding internal hemorrhoids and diverticulosis. Patient has history of acute sigmoid diverticulitis with microperforation treated with bowel rest and IV antibiotics during hospital stay of 08/10/17. .   Encounter Medications:  Outpatient Encounter Medications as of 01/08/2018  Medication Sig Note  . Biotin 1000 MCG tablet Take by mouth.   Marland Kitchen FREESTYLE LITE test strip USE TO TEST BLOOD SUGAR BID 02/23/2016: Received from: External Pharmacy  . glucosamine-chondroitin 500-400 MG tablet Take by mouth.   . loratadine (CLARITIN) 10 MG tablet loratadine 10 mg tablet  TK 1 T PO D   . meloxicam (MOBIC) 15 MG tablet Take 1 tablet (15 mg total) by mouth daily. With food 11/20/2017: Takes prn  . pantoprazole (PROTONIX) 40 MG tablet Take 1 tablet (40 mg total) by mouth daily.   . sennosides-docusate sodium (SENOKOT-S) 8.6-50 MG tablet Take 1 tablet by mouth daily.      Functional Status:  In your present state of health, do you have any difficulty performing the following activities: 11/20/2017 10/18/2017  Hearing? N N  Vision? N Y  Comment - contacts  Difficulty concentrating or making decisions? N N  Walking or climbing stairs? N N  Dressing or bathing? N N  Doing errands, shopping? N N  Preparing Food and eating ? N N  Using the Toilet? N N  In the past six months, have you accidently leaked urine? N N  Do you have problems with loss of bowel control? N N  Managing your  Medications? N N  Managing your Finances? N N  Housekeeping or managing your Housekeeping? N N  Some recent data might be hidden    Fall/Depression Screening: Fall Risk  11/20/2017  Falls in the past year? No   PHQ 2/9 Scores 11/20/2017 10/18/2017  PHQ - 2 Score 0 0    Assessment: Needs to establish primary care provider in time for annual wellness exam due in early October 2019.   Plan:  [facility] CM Care Plan Problem One     Most Recent Value  Care Plan for Problem One  Active  THN Long Term Goal   In the next 90 days patient will call [employer] MD referral line to assist her with establishing a new primary care provider  [place] Term Goal Start Date  01/08/18  Interventions for Problem One Long Term Goal assessed current chronic disease self managament strategies, provided her with MD referral line phone number and encouraged her to call in order to schedule annual wellness exam due in early October 2019, with Kitt's  agreement will plan to call again October for routine disease useful management assistance follow up     Today's note not routed to primary care provider as she has not established one yet.   Barrington Ellison RN,CCM,CDE [place] Management Coordinator Office Phone [phone] Office Fax [phone]

## 2018-01-15 ENCOUNTER — Other Ambulatory Visit: Payer: Self-pay | Admitting: *Deleted

## 2018-01-15 NOTE — Patient Outreach (Signed)
[name] [facility]) Care Management  01/15/2018  Mary Colon Jul 27, 1950 [id number]   Sent e-mail to [name]'s personal e-mail address inviting her to the [place] at [facility] on 01/19/18 from 9a-1pm since she is working to establish another primary care provider. Information included was the ability to get a retinal scan, A1C check and diabetes information at the health fair.  Barrington Ellison RN,CCM,CDE [place] Management Coordinator Office Phone [phone] Office Fax [phone]

## 2018-03-05 ENCOUNTER — Other Ambulatory Visit: Payer: Self-pay | Admitting: *Deleted

## 2018-03-05 NOTE — Patient Outreach (Signed)
[facility] [facility]) Care Management  03/05/2018  Troy Hartzog 23-Apr-1951 [id number]   HIPAA compliant e-mail sent to [place] requesting return call or e-mail to schedule October follow up telephone assessment for the [employer] quality diabetes program.  Await response from [place].  Barrington Ellison RN,CCM,CDE [place] Management Coordinator Office Phone [phone] Office Fax [phone]

## 2018-03-18 ENCOUNTER — Other Ambulatory Visit: Payer: Self-pay | Admitting: *Deleted

## 2018-03-18 NOTE — Patient Outreach (Signed)
[facility]) Care Management  03/18/2018  Mary Colon 27-Jan-1951 [id number]   Spoke with Christain to arrange routine follow up  telephone assessment for the [employer] quality diabetes program. Per her request, arranged to call her on Wednesday 04/03/18 at 10:00 am.  Barrington Ellison RN,CCM,CDE [place] Management Coordinator Office Phone [phone] Office Fax [phone]

## 2018-04-03 ENCOUNTER — Other Ambulatory Visit: Payer: Self-pay | Admitting: *Deleted

## 2018-04-03 NOTE — Patient Outreach (Addendum)
[facility]) Care Management  [place]  04/03/2018   Mary Colon 01/01/1951 [id number]   Routine follow up call:  [employer] quality diabetes program  Subjective: Spoke with Mary Colon via phone, she states she is doing well , no further bowel issues, says her colonoscopy done 12/20/17 was normal except for a small hemorrhoid. She says she was told she will need a repeat colonoscopy every 1-2 years. She states she is very pleased with Dr. Lyndel Safe, her gastroenterologist.  She states she is maintaining a healthier meal plan to improve both her husband and her health because her husband has heart issues. She denies the need for further CHO controlled  meal planning education.  She states she is checking her blood sugar every morning and the values range from 125-135, much improved from previous readings. She says she does not check post meal.  She says she takes no medication for her diabetes,  cholesterol and blood pressure and states her personal goal is to manage these comorbidities without the need for medications.  She says they are not exercising.  She has not established a primary care provider and her annual wellness exam is due this month so she asked for assistance with determining if Laverna Peace NP is in network.    Objective: Electronic medical record indicates colonoscopy done on 12/20/17 showed non bleeding internal hemorrhoids and diverticulosis. Patient has history of acute sigmoid diverticulitis with microperforation treated with bowel rest and IV antibiotics during hospital stay of 08/10/17. There have been no emergency room visits  or hospitalizations since March.   Outpatient Encounter Medications as of 04/03/2018  Medication Sig Note  . FREESTYLE LITE test strip USE TO TEST BLOOD SUGAR BID 02/23/2016: Received from: External Pharmacy  . Nutritional Supplements (JUICE PLUS FIBRE PO) Take 1 capsule by mouth daily.   . pantoprazole (PROTONIX) 40 MG  tablet Take 1 tablet (40 mg total) by mouth daily. 04/03/2018: Takes one tablet every other day  . sennosides-docusate sodium (SENOKOT-S) 8.6-50 MG tablet Take 1 tablet by mouth daily.        Functional Status:  In your present state of health, do you have any difficulty performing the following activities: 04/03/2018 11/20/2017  Hearing? N N  Vision? N N  Comment - -  Difficulty concentrating or making decisions? N N  Walking or climbing stairs? N N  Dressing or bathing? N N  Doing errands, shopping? N N  Preparing Food and eating ? N N  Using the Toilet? N N  In the past six months, have you accidently leaked urine? N N  Do you have problems with loss of bowel control? N N  Managing your Medications? N N  Managing your Finances? N N  Housekeeping or managing your Housekeeping? N N  Some recent data might be hidden    Fall/Depression Screening: Fall Risk  11/20/2017  Falls in the past year? No   PHQ 2/9 Scores 11/20/2017 10/18/2017  PHQ - 2 Score 0 0    Assessment: Needs to establish primary care provider in time for annual wellness exam due in early October 2019.   Plan:  [facility] CM Care Plan Problem One     Most Recent Value  Care Plan for Problem One  Active  THN Long Term Goal   In the next 90 days patient will establish a new primary care provider and complete initial office visit and begin checking post meal blood sugars once- twice weekly  [facility]  Long Term Goal Start Date  04/03/18  Interventions for Problem One Long Term Goal assessed current chronic disease self management strategies, congratulated Mary Colon on her healthy meal planning and improvement in fasting blood sugars, suggested she check 1-2 post meal blood sugars weekly with target of <180, emailed target blood sugar information to her, determined Amy Manson Allan is an in network provider for her health plan and per patient request emailed her the information, emailed  her MD referral line, encouraged her to establish a  primary care provider as soon as possible  in order to schedule annual wellness exam that is due October 2019, with Rayli's  agreement will plan to call again on Dec 18th at 10:00 am  for routine chronic disease  management assistance follow up     Today's note not routed to primary care provider as she has not established one yet.   Barrington Ellison RN,CCM,CDE [place] Management Coordinator Office Phone [phone] Office Fax [phone]

## 2018-04-18 DIAGNOSIS — Z1339 Encounter for screening examination for other mental health and behavioral disorders: Secondary | ICD-10-CM | POA: Diagnosis not present

## 2018-04-18 DIAGNOSIS — Z2821 Immunization not carried out because of patient refusal: Secondary | ICD-10-CM | POA: Diagnosis not present

## 2018-04-18 DIAGNOSIS — E1165 Type 2 diabetes mellitus with hyperglycemia: Secondary | ICD-10-CM | POA: Diagnosis not present

## 2018-04-18 DIAGNOSIS — Z23 Encounter for immunization: Secondary | ICD-10-CM | POA: Diagnosis not present

## 2018-04-18 DIAGNOSIS — Z6830 Body mass index (BMI) 30.0-30.9, adult: Secondary | ICD-10-CM | POA: Diagnosis not present

## 2018-04-18 DIAGNOSIS — E785 Hyperlipidemia, unspecified: Secondary | ICD-10-CM | POA: Diagnosis not present

## 2018-04-18 DIAGNOSIS — Z79899 Other long term (current) drug therapy: Secondary | ICD-10-CM | POA: Diagnosis not present

## 2018-04-18 DIAGNOSIS — Z0000 Encounter for general adult medical examination without abnormal findings: Secondary | ICD-10-CM | POA: Diagnosis not present

## 2018-05-16 DIAGNOSIS — R293 Abnormal posture: Secondary | ICD-10-CM | POA: Diagnosis not present

## 2018-05-16 DIAGNOSIS — M5412 Radiculopathy, cervical region: Secondary | ICD-10-CM | POA: Diagnosis not present

## 2018-05-16 DIAGNOSIS — M9902 Segmental and somatic dysfunction of thoracic region: Secondary | ICD-10-CM | POA: Diagnosis not present

## 2018-05-16 DIAGNOSIS — M4802 Spinal stenosis, cervical region: Secondary | ICD-10-CM | POA: Diagnosis not present

## 2018-05-16 DIAGNOSIS — M9901 Segmental and somatic dysfunction of cervical region: Secondary | ICD-10-CM | POA: Diagnosis not present

## 2018-05-16 DIAGNOSIS — M9903 Segmental and somatic dysfunction of lumbar region: Secondary | ICD-10-CM | POA: Diagnosis not present

## 2018-05-16 DIAGNOSIS — M50322 Other cervical disc degeneration at C5-C6 level: Secondary | ICD-10-CM | POA: Diagnosis not present

## 2018-05-17 IMAGING — CT CT MAXILLOFACIAL W/O CM
4 of 7 series · 16 of 47 positions shown, 19 images · non-contrast
Comparison: None.

CLINICAL DATA: Status post fall.  Laceration of the forehead.

EXAM:
CT HEAD WITHOUT CONTRAST
CT MAXILLOFACIAL WITHOUT CONTRAST
TECHNIQUE: Multidetector CT imaging of the head and maxillofacial structures
were performed using the standard protocol without intravenous
contrast. Multiplanar CT image reconstructions of the maxillofacial
structures were also generated.

[Series 4: head 2.0 h70h · axial · 0.43mm/px · z∈[+1156,+1294]mm · 11 of 83 slices shown, 14 images]
[im 7/83  brain]
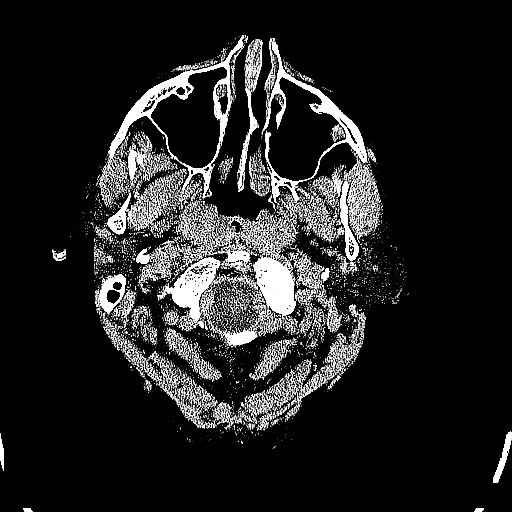
[im 7/83  bone]
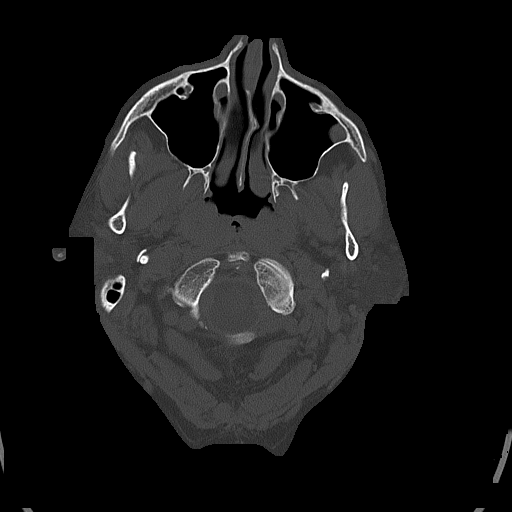
[im 14/83  bone]
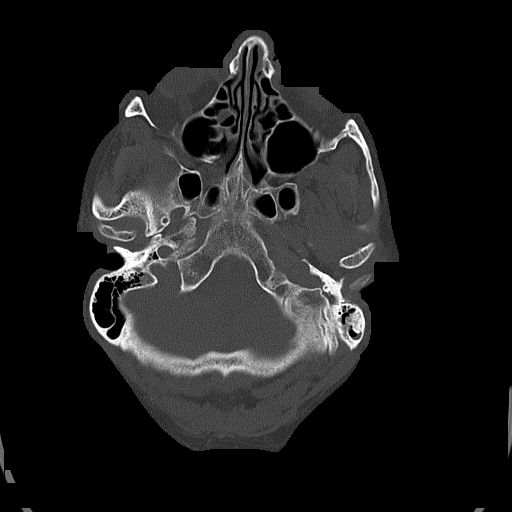
[im 21/83  bone]
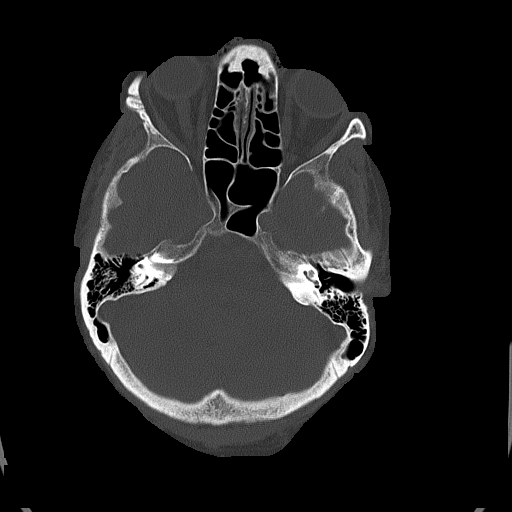
[im 28/83  bone]
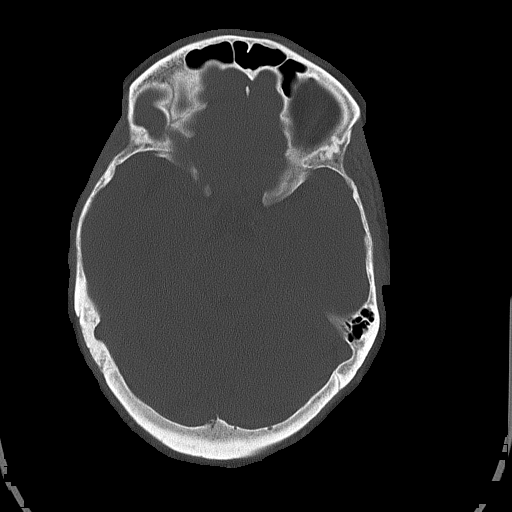
[im 35/83  brain]
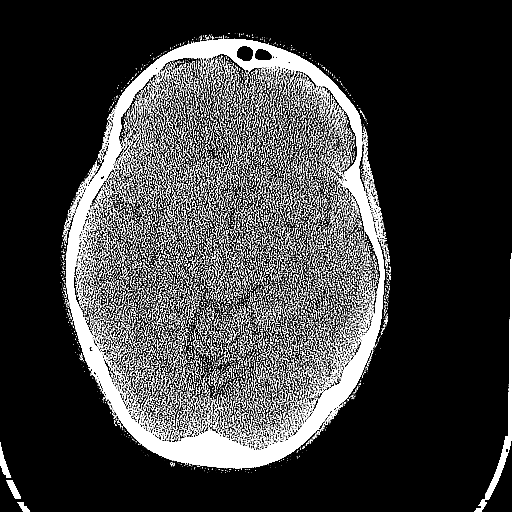
[im 35/83  bone]
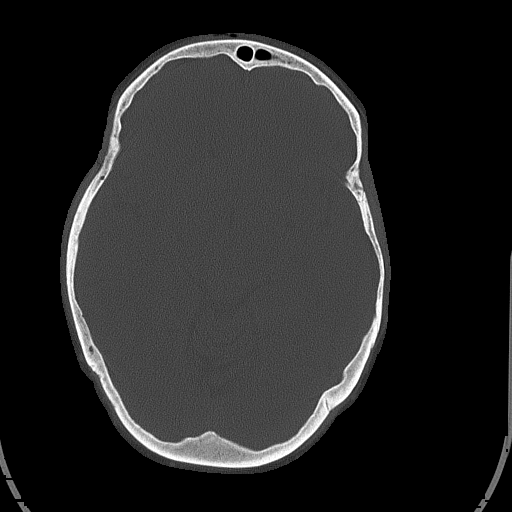
[im 42/83  bone]
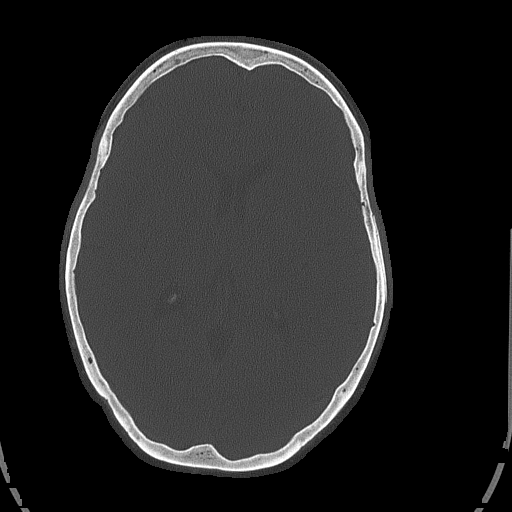
[im 48/83  bone]
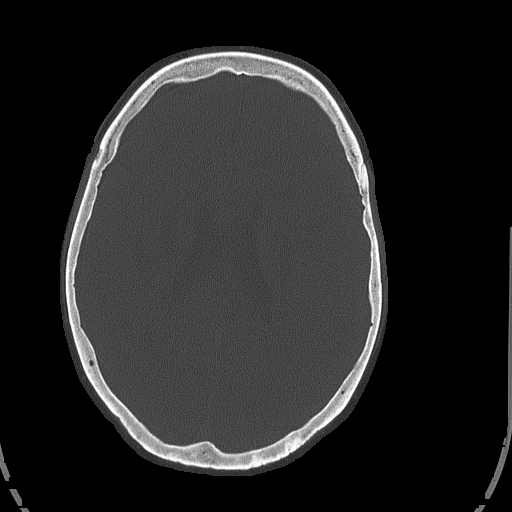
[im 55/83  bone]
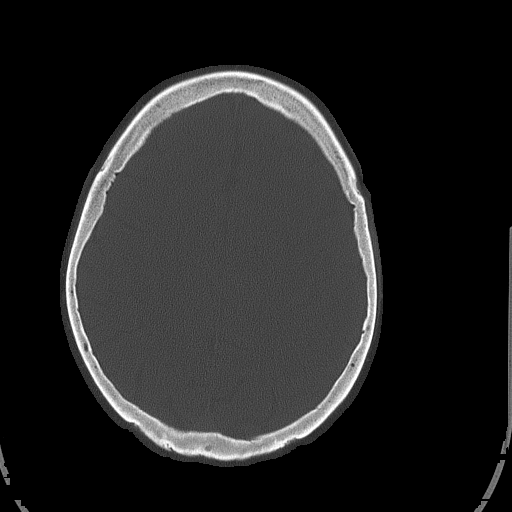
[im 62/83  brain]
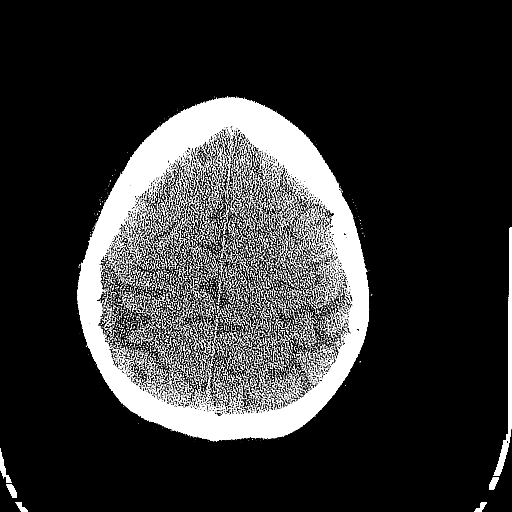
[im 62/83  bone]
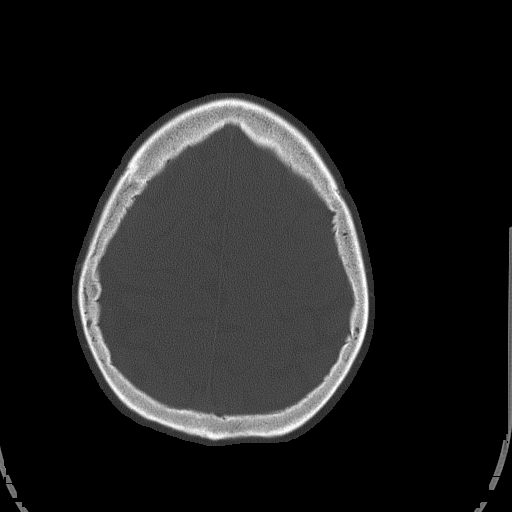
[im 69/83  bone]
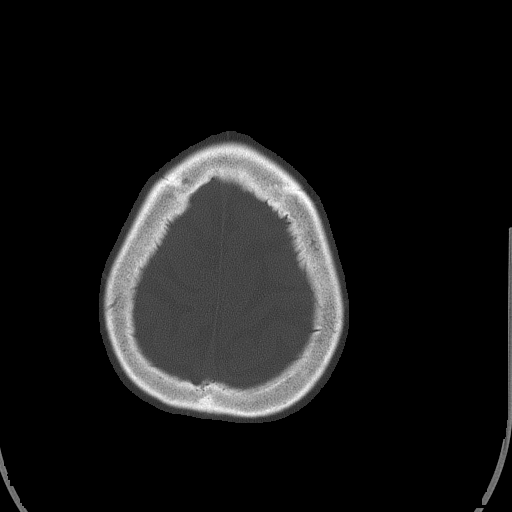
[im 76/83  bone]
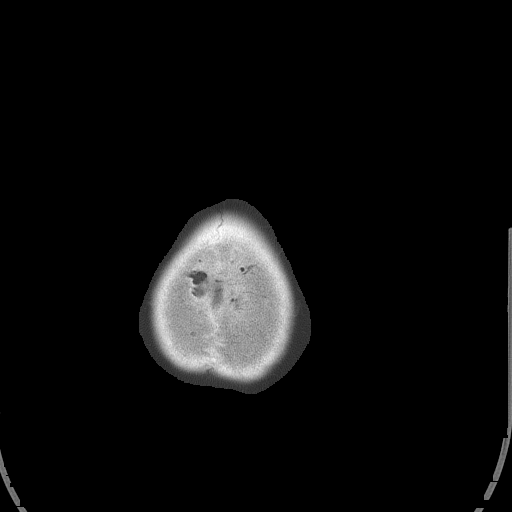

[Series 5: head 3.0 mpr cor · coronal · 0.34mm/px · 2 of 72 slices shown]
[im 24/72  bone]
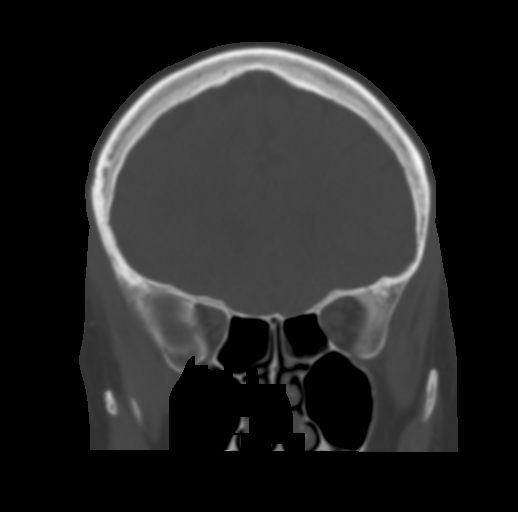
[im 48/72  bone]
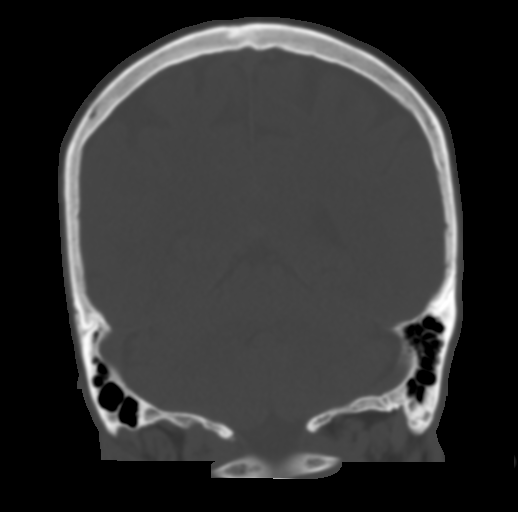

[Series 6: head 3.0 mpr sag · sagittal · 0.32mm/px · 1 of 55 slices shown]
[im 28/55  bone]
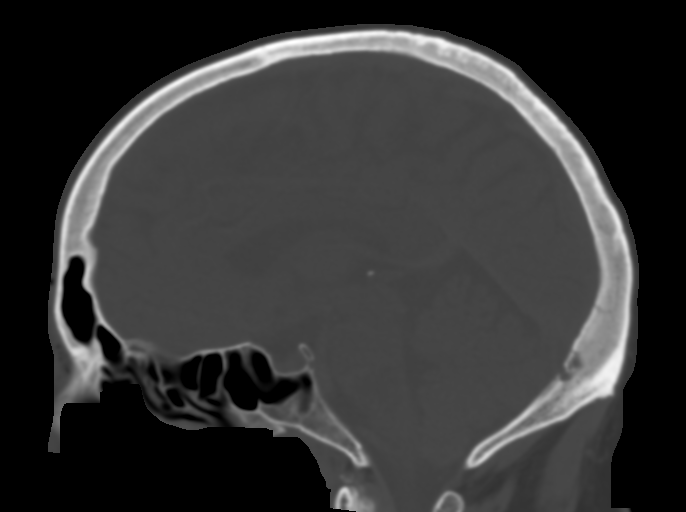

[Series 7: facial/ orbits 2.0 h30s · axial · 0.34mm/px · z∈[+1071,+1085]mm · 2 of 82 slices shown]
[im 8/82  bone]
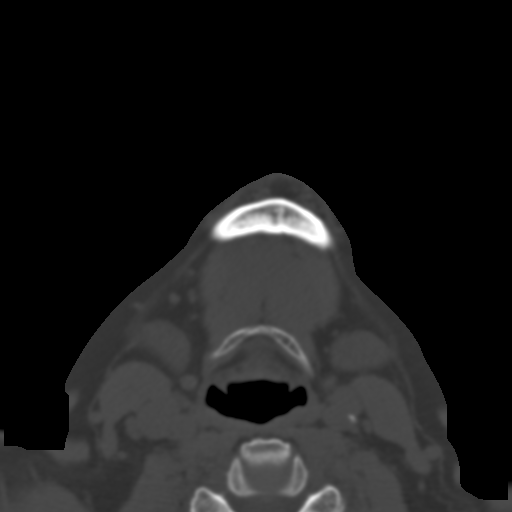
[im 15/82  bone]
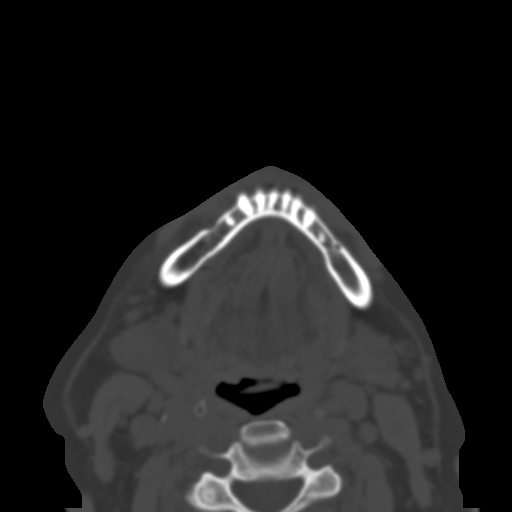

[16 of 47 positions shown; findings below may reference images not displayed]

FINDINGS: CT HEAD FINDINGS

Brain: No evidence of acute infarction, hemorrhage, hydrocephalus,
extra-axial collection or mass lesion/mass effect.

Vascular: No hyperdense vessel or unexpected calcification.

Skull: No osseous abnormality.

Other: Laceration of the forehead with soft tissue emphysema.

CT MAXILLOFACIAL FINDINGS

Osseous: Nondisplaced fracture of the right nasal bone with
overlying soft tissue laceration and soft tissue emphysema. No other
fracture or mandibular dislocation. No destructive process.
Degenerative disc disease with disc height loss at C3-4 with right
facet arthropathy and right foraminal stenosis.

Orbits: Negative. No traumatic or inflammatory finding.

Sinuses: Clear.

Soft tissues: No soft tissue abnormality.
IMPRESSION: 1. No acute intracranial pathology.
2. Nondisplaced fracture of the right nasal bone with overlying soft
tissue laceration and soft tissue emphysema.
3. Frontal scalp laceration.

## 2018-05-20 DIAGNOSIS — M9901 Segmental and somatic dysfunction of cervical region: Secondary | ICD-10-CM | POA: Diagnosis not present

## 2018-05-20 DIAGNOSIS — M9903 Segmental and somatic dysfunction of lumbar region: Secondary | ICD-10-CM | POA: Diagnosis not present

## 2018-05-20 DIAGNOSIS — M9902 Segmental and somatic dysfunction of thoracic region: Secondary | ICD-10-CM | POA: Diagnosis not present

## 2018-05-20 DIAGNOSIS — M5412 Radiculopathy, cervical region: Secondary | ICD-10-CM | POA: Diagnosis not present

## 2018-05-20 DIAGNOSIS — M50322 Other cervical disc degeneration at C5-C6 level: Secondary | ICD-10-CM | POA: Diagnosis not present

## 2018-05-20 DIAGNOSIS — R293 Abnormal posture: Secondary | ICD-10-CM | POA: Diagnosis not present

## 2018-05-20 DIAGNOSIS — M4802 Spinal stenosis, cervical region: Secondary | ICD-10-CM | POA: Diagnosis not present

## 2018-05-21 DIAGNOSIS — M4802 Spinal stenosis, cervical region: Secondary | ICD-10-CM | POA: Diagnosis not present

## 2018-05-21 DIAGNOSIS — M9903 Segmental and somatic dysfunction of lumbar region: Secondary | ICD-10-CM | POA: Diagnosis not present

## 2018-05-21 DIAGNOSIS — M9901 Segmental and somatic dysfunction of cervical region: Secondary | ICD-10-CM | POA: Diagnosis not present

## 2018-05-21 DIAGNOSIS — M9902 Segmental and somatic dysfunction of thoracic region: Secondary | ICD-10-CM | POA: Diagnosis not present

## 2018-05-21 DIAGNOSIS — R293 Abnormal posture: Secondary | ICD-10-CM | POA: Diagnosis not present

## 2018-05-21 DIAGNOSIS — M50322 Other cervical disc degeneration at C5-C6 level: Secondary | ICD-10-CM | POA: Diagnosis not present

## 2018-05-21 DIAGNOSIS — M5412 Radiculopathy, cervical region: Secondary | ICD-10-CM | POA: Diagnosis not present

## 2018-05-23 DIAGNOSIS — Z1231 Encounter for screening mammogram for malignant neoplasm of breast: Secondary | ICD-10-CM | POA: Diagnosis not present

## 2018-05-23 DIAGNOSIS — M9901 Segmental and somatic dysfunction of cervical region: Secondary | ICD-10-CM | POA: Diagnosis not present

## 2018-05-23 DIAGNOSIS — M50322 Other cervical disc degeneration at C5-C6 level: Secondary | ICD-10-CM | POA: Diagnosis not present

## 2018-05-23 DIAGNOSIS — M5412 Radiculopathy, cervical region: Secondary | ICD-10-CM | POA: Diagnosis not present

## 2018-05-23 DIAGNOSIS — M9902 Segmental and somatic dysfunction of thoracic region: Secondary | ICD-10-CM | POA: Diagnosis not present

## 2018-05-23 DIAGNOSIS — R293 Abnormal posture: Secondary | ICD-10-CM | POA: Diagnosis not present

## 2018-05-23 DIAGNOSIS — M9903 Segmental and somatic dysfunction of lumbar region: Secondary | ICD-10-CM | POA: Diagnosis not present

## 2018-05-23 DIAGNOSIS — M4802 Spinal stenosis, cervical region: Secondary | ICD-10-CM | POA: Diagnosis not present

## 2018-05-27 DIAGNOSIS — M50322 Other cervical disc degeneration at C5-C6 level: Secondary | ICD-10-CM | POA: Diagnosis not present

## 2018-05-27 DIAGNOSIS — M9903 Segmental and somatic dysfunction of lumbar region: Secondary | ICD-10-CM | POA: Diagnosis not present

## 2018-05-27 DIAGNOSIS — M4802 Spinal stenosis, cervical region: Secondary | ICD-10-CM | POA: Diagnosis not present

## 2018-05-27 DIAGNOSIS — M9901 Segmental and somatic dysfunction of cervical region: Secondary | ICD-10-CM | POA: Diagnosis not present

## 2018-05-27 DIAGNOSIS — R293 Abnormal posture: Secondary | ICD-10-CM | POA: Diagnosis not present

## 2018-05-27 DIAGNOSIS — M9902 Segmental and somatic dysfunction of thoracic region: Secondary | ICD-10-CM | POA: Diagnosis not present

## 2018-05-27 DIAGNOSIS — M5412 Radiculopathy, cervical region: Secondary | ICD-10-CM | POA: Diagnosis not present

## 2018-05-28 DIAGNOSIS — M9902 Segmental and somatic dysfunction of thoracic region: Secondary | ICD-10-CM | POA: Diagnosis not present

## 2018-05-28 DIAGNOSIS — R293 Abnormal posture: Secondary | ICD-10-CM | POA: Diagnosis not present

## 2018-05-28 DIAGNOSIS — M4802 Spinal stenosis, cervical region: Secondary | ICD-10-CM | POA: Diagnosis not present

## 2018-05-28 DIAGNOSIS — M9903 Segmental and somatic dysfunction of lumbar region: Secondary | ICD-10-CM | POA: Diagnosis not present

## 2018-05-28 DIAGNOSIS — M50322 Other cervical disc degeneration at C5-C6 level: Secondary | ICD-10-CM | POA: Diagnosis not present

## 2018-05-28 DIAGNOSIS — M9901 Segmental and somatic dysfunction of cervical region: Secondary | ICD-10-CM | POA: Diagnosis not present

## 2018-05-28 DIAGNOSIS — M5412 Radiculopathy, cervical region: Secondary | ICD-10-CM | POA: Diagnosis not present

## 2018-05-29 ENCOUNTER — Other Ambulatory Visit: Payer: Self-pay | Admitting: *Deleted

## 2018-05-29 DIAGNOSIS — M5430 Sciatica, unspecified side: Secondary | ICD-10-CM

## 2018-05-29 DIAGNOSIS — E089 Diabetes mellitus due to underlying condition without complications: Secondary | ICD-10-CM

## 2018-05-29 NOTE — Patient Outreach (Addendum)
[facility] [facility]) Care Management  [place]  05/29/2018   Mary Colon Feb 17, 1951 [id number]   Routine follow up call:  [employer] Diabetes Quality program  Subjective: Spoke with [name] via phone, she states she is doing well , no recent hospitalizations or emergency room visits.  She says she established care with Dr Serita Grammes at [facility] on 04/18/18 and also attended a [employer] health fair on 11/22. She says she had blood work drawn at both places but does not know the results.  She says she was not prescribed any additional medications at the 04/18/18 office visit. She says she has a return office visit with Dr Jeryl Columbia on 07/17/2018.  She states she is checking her blood sugar every morning and the values range from 105-120, again showing ongoing improvement  from previous readings. She says she does not check post meal. She denies the need for further CHO controlled  meal planning education.  She says she takes no medication for her diabetes,  cholesterol and blood pressure and states her personal goal is to manage these comorbidities without the need for medications.  She says she has been bothered recently with sciatic pain and used to take glucosamine chondroitin for the pain and is asking if this appropriate and how to ensure she is purchasing a quality supplement.  She says she refuses vaccines and flu shots.    Objective: Per KPN, Knowledge Performance Now, point of care tool, lab results show elevated Hgb A1C at 7.1%, elevated cholesterol at 264, Low HDL at 45 and elevated LDL at 196, triglycerides meeting target at 108 and fasting glucose 111, urine microalbumin= 20, liver enzymes, CBC and all other comprehensive metabolic panel elements meeting target.  There have been no emergency room visits  or hospitalizations since March.   Outpatient Encounter Medications as of 04/03/2018  Medication Sig Note  . FREESTYLE LITE  test strip USE TO TEST BLOOD SUGAR BID 02/23/2016: Received from: External Pharmacy  . Nutritional Supplements (JUICE PLUS FIBRE PO) Take 1 capsule by mouth daily.   . pantoprazole (PROTONIX) 40 MG tablet Take 1 tablet (40 mg total) by mouth daily. 04/03/2018: Takes one tablet every other day  . sennosides-docusate sodium (SENOKOT-S) 8.6-50 MG tablet Take 1 tablet by mouth daily.        Functional Status:  In your present state of health, do you have any difficulty performing the following activities: 04/03/2018 11/20/2017  Hearing? N N  Vision? N N  Comment - -  Difficulty concentrating or making decisions? N N  Walking or climbing stairs? N N  Dressing or bathing? N N  Doing errands, shopping? N N  Preparing Food and eating ? N N  Using the Toilet? N N  In the past six months, have you accidently leaked urine? N N  Do you have problems with loss of bowel control? N N  Managing your Medications? N N  Managing your Finances? N N  Housekeeping or managing your Housekeeping? N N  Some recent data might be hidden    Fall/Depression Screening: Fall Risk  11/20/2017  Falls in the past year? No   PHQ 2/9 Scores 11/20/2017 10/18/2017  PHQ - 2 Score 0 0    Assessment: Improved blood sugar control as evidenced by fasting values and Hgb A1C of 7.1%, not meeting Hgb A1C target of <7.0%  Plan: [place] wishes to control chronic disease states with lifestyle interventions and to use medications  as a last resort.   [facility] CM Care Plan Problem One     Most Recent Value  Care Plan Problem One Knowledge deficit related to self management of Type 2 diabetes and hyperlipidemia; not meeting targets for Hgb A1C and lipid panel  Role Documenting the Problem One  Care Management [place] for Problem One  Active  THN Long Term Goal  In the next 90 days patient will begin checking post meal blood sugars at least  once- twice weekly and will demonstrate problem solving skills if post meal blood  sugar targets of <180 not met, will voice that she is continuing to follow a CHO controlled meal plan, will voice review of "Cholesterol and Triglycerides"  handout, and will keep follow up appointment with primary care provider on 07/17/2018  [facility] Long Term Goal Start Date  05/29/18  Interventions for Problem One Long Term Goal assessed current chronic disease self management strategies, congratulated Phillip on her consistent adherence to healthy meal planning and improvement in fasting blood sugars, reviewed results of Hgb A1C and again emphasized she check post meal blood sugars since all fasting blood sugars are meeting target but Hgb A1C is >7.0%, reviewed lipid panel results and emailed her the handout "Cholesterol and Triglycerides" and asked her to review the handout for strategies to improve her cholesterol panel, referred her to [place] [facility] pharmacist for guidance on choosing a glucosamine chondroitin supplement for sciatica pain, congratulated her on establishing a primary care provider, discussed reassignment of RN health coach for routine chronic disease management assistance follow up in 3 months (March 2020)       Will route today's note to primary care provider, Dr Serita Grammes. Newly assigned RN health coach will follow up in 3 months for routine chronic disease self management assistance.  Barrington Ellison RN,CCM,CDE [place] Management Coordinator Office Phone [phone] Office Fax [phone]

## 2018-05-29 NOTE — Addendum Note (Signed)
Addended by: Barrington Ellison on: 05/29/2018 12:58 PM   Modules accepted: Orders

## 2018-05-30 ENCOUNTER — Telehealth: Payer: Self-pay | Admitting: Pharmacist

## 2018-05-30 ENCOUNTER — Encounter: Payer: Self-pay | Admitting: *Deleted

## 2018-05-30 DIAGNOSIS — M4802 Spinal stenosis, cervical region: Secondary | ICD-10-CM | POA: Diagnosis not present

## 2018-05-30 DIAGNOSIS — M9901 Segmental and somatic dysfunction of cervical region: Secondary | ICD-10-CM | POA: Diagnosis not present

## 2018-05-30 DIAGNOSIS — M9902 Segmental and somatic dysfunction of thoracic region: Secondary | ICD-10-CM | POA: Diagnosis not present

## 2018-05-30 DIAGNOSIS — R293 Abnormal posture: Secondary | ICD-10-CM | POA: Diagnosis not present

## 2018-05-30 DIAGNOSIS — M9903 Segmental and somatic dysfunction of lumbar region: Secondary | ICD-10-CM | POA: Diagnosis not present

## 2018-05-30 DIAGNOSIS — M5412 Radiculopathy, cervical region: Secondary | ICD-10-CM | POA: Diagnosis not present

## 2018-05-30 DIAGNOSIS — M50322 Other cervical disc degeneration at C5-C6 level: Secondary | ICD-10-CM | POA: Diagnosis not present

## 2018-05-30 NOTE — Patient Outreach (Addendum)
[facility]) Care Management  05/30/2018  Mary Colon January 11, 1951 [id number]   Called patient per referral about Glucosamine and Chondrosin products. HIPAA identifiers were obtained.    Patient is a 67 year old female with hypertension, hyperlipidemia, obesity and type 2 diabetes. She is not currently taking any diabetes medications (HgA1c 8.5% 08/10/17, currently 7.1%)  Patient's medications were reviewed via telephone.  She requested information on the quality of various Glucosamine Chondrosin products as well as inquired about efficacy.  The information was requested to be emailed.    Patient was emailed the link for labdoor.com for quality analysis of Glucosamine-Chondrosin products:  https://labdoor.com/rankings/glucosamine/quality  She was also sent the following articles:  JAMA. 2010 Jul 7;304(1):45-52. doi: 10.1001/jama.2010.893. Effect of glucosamine on pain-related disability in patients with chronic low back pain and degenerative lumbar osteoarthritis: a randomized controlled trial.  BMJ. 2010 Sep 16;341:c4675. doi: 10.1136/bmj.c4675. The use of glucosamine for chronic low back pain: a systematic review of randomized control trials   No PHI or HIPAA information was sent via e-mail.  Plan: Close Pharmacy Episode. Route note back to Kelli Churn, RN  Elayne Guerin, PharmD, [place] Clinical Pharmacist [phone]

## 2018-06-10 DIAGNOSIS — M9902 Segmental and somatic dysfunction of thoracic region: Secondary | ICD-10-CM | POA: Diagnosis not present

## 2018-06-10 DIAGNOSIS — M50322 Other cervical disc degeneration at C5-C6 level: Secondary | ICD-10-CM | POA: Diagnosis not present

## 2018-06-10 DIAGNOSIS — M9901 Segmental and somatic dysfunction of cervical region: Secondary | ICD-10-CM | POA: Diagnosis not present

## 2018-06-10 DIAGNOSIS — R293 Abnormal posture: Secondary | ICD-10-CM | POA: Diagnosis not present

## 2018-06-10 DIAGNOSIS — M5412 Radiculopathy, cervical region: Secondary | ICD-10-CM | POA: Diagnosis not present

## 2018-06-10 DIAGNOSIS — M9903 Segmental and somatic dysfunction of lumbar region: Secondary | ICD-10-CM | POA: Diagnosis not present

## 2018-06-10 DIAGNOSIS — M4802 Spinal stenosis, cervical region: Secondary | ICD-10-CM | POA: Diagnosis not present

## 2018-06-20 DIAGNOSIS — M542 Cervicalgia: Secondary | ICD-10-CM | POA: Diagnosis not present

## 2018-06-20 DIAGNOSIS — M25511 Pain in right shoulder: Secondary | ICD-10-CM | POA: Diagnosis not present

## 2018-06-24 DIAGNOSIS — M25511 Pain in right shoulder: Secondary | ICD-10-CM | POA: Diagnosis not present

## 2018-06-24 DIAGNOSIS — M542 Cervicalgia: Secondary | ICD-10-CM | POA: Diagnosis not present

## 2018-06-26 DIAGNOSIS — M542 Cervicalgia: Secondary | ICD-10-CM | POA: Diagnosis not present

## 2018-06-26 DIAGNOSIS — M25511 Pain in right shoulder: Secondary | ICD-10-CM | POA: Diagnosis not present

## 2018-07-01 DIAGNOSIS — M542 Cervicalgia: Secondary | ICD-10-CM | POA: Diagnosis not present

## 2018-07-01 DIAGNOSIS — M25511 Pain in right shoulder: Secondary | ICD-10-CM | POA: Diagnosis not present

## 2018-07-08 DIAGNOSIS — M25511 Pain in right shoulder: Secondary | ICD-10-CM | POA: Diagnosis not present

## 2018-07-08 DIAGNOSIS — M542 Cervicalgia: Secondary | ICD-10-CM | POA: Diagnosis not present

## 2018-08-26 ENCOUNTER — Other Ambulatory Visit: Payer: Self-pay | Admitting: *Deleted

## 2018-08-26 NOTE — Patient Outreach (Signed)
[place] [facility]) Care Management  08/26/2018  Kylyn Mcdade 09/15/1950 [id number]   [place] Monthly Outreach  Referral Date:  10/10/2017 Referral Source:  Transfer from [place] Management Coordinator Reason for Referral:  Chronic Care Improvement Program/Disease Management Education Insurance:  [employer] Ad[employer]age   Outreach Attempt:  Outreach attempt #1 to patient for quarterly follow up. Telephone line busy.  Plan:  RN Health Coach will make another outreach attempt within the month of March.   [place] [id number] [email or web address]

## 2018-09-09 ENCOUNTER — Other Ambulatory Visit: Payer: Self-pay

## 2018-09-09 ENCOUNTER — Other Ambulatory Visit: Payer: Self-pay | Admitting: *Deleted

## 2018-09-09 ENCOUNTER — Encounter: Payer: Self-pay | Admitting: *Deleted

## 2018-09-09 NOTE — Patient Outreach (Signed)
[place] [facility]) Care Management  09/09/2018  Mary Colon [birth year] [id number]   RN Health Coach Monthly Outreach  Referral Date:  10/10/2017 Referral Source:  Transfer from [place] Management Coordinator Reason for Referral:  Chronic Care Improvement Program/Disease Management Education Insurance:  Health Team Advantage   Outreach Attempt:  Outreach attempt #2 to patient for introduction and follow up. No answer. RN Health Coach left HIPAA compliant voicemail message along with contact information.  Plan:  RN Health Coach will send unsuccessful outreach letter to patient.  RN Health Coach will make another outreach attempt to patient within the month of April if no return call back from patient.  [place] [phone] [email or web address]

## 2018-09-09 NOTE — Patient Outreach (Signed)
[place] [facility]) Care Management  [place]  09/09/2018   Lauranne Beyersdorf 06/26/1950 [id number]   RN Health Coach Monthly Outreach   Referral Date:  10/10/2017 Referral Source:  Transfer from [place] Management Coordinator Reason for Referral:  Chronic Care Improvement Program/Disease Management Education Insurance:  Health Team Advantage   Outreach Attempt:  Received telephone call back from patient.  HIPAA verified with patient.  Patient reporting she is doing well.  Does report her fasting blood sugars have been a little more elevated than normal.  Fasting blood sugar this morning was 153, with fasting ranges of 130-160's.  Denies any hypoglycemic episodes or sick days.  Last Hgb A1C was 7.1 in 04/2018.  Discussed ways to help reduce fasting blood sugars (increasing activity and low carbohydrate diabetic diet).  Continues to strive to manage diabetes with diet and exercise.  Encounter Medications:  Outpatient Encounter Medications as of 09/09/2018  Medication Sig Note  . FREESTYLE LITE test strip USE TO TEST BLOOD SUGAR BID   . loratadine (CLARITIN) 10 MG tablet loratadine 10 mg tablet  TK 1 T PO D   . Nutritional Supplements (JUICE PLUS FIBRE PO) Take 1 capsule by mouth daily.   . pantoprazole (PROTONIX) 40 MG tablet Take 1 tablet (40 mg total) by mouth daily. (Patient taking differently: Take 40 mg by mouth every other day. ) 04/03/2018: Takes one tablet every other day  . sennosides-docusate sodium (SENOKOT-S) 8.6-50 MG tablet Take 1 tablet by mouth daily.   [place] Kitchen glucosamine-chondroitin 500-400 MG tablet Take by mouth. 09/09/2018: Reports not taking at current time   Facility-Administered Encounter Medications as of 09/09/2018  Medication  . 0.9 %  sodium chloride infusion    Functional Status:  In your present state of health, do you have any difficulty performing the following activities: 09/09/2018 04/03/2018  Hearing? N N  Vision? N N  Comment - -   Difficulty concentrating or making decisions? N N  Walking or climbing stairs? N N  Dressing or bathing? N N  Doing errands, shopping? N N  Preparing Food and eating ? N N  Using the Toilet? N N  In the past six months, have you accidently leaked urine? N N  Do you have problems with loss of bowel control? N N  Managing your Medications? N N  Managing your Finances? N N  Housekeeping or managing your Housekeeping? N N  Some recent data might be hidden    Fall/Depression Screening: Fall Risk  09/09/2018 11/20/2017  Falls in the past year? 0 No  Follow up Falls evaluation completed;Falls prevention discussed;Education provided -   PHQ 2/9 Scores 11/20/2017 10/18/2017  PHQ - 2 Score 0 0   THN CM Care Plan Problem One     Most Recent Value  Care Plan Problem One  Knowledge deficiet related to self care management of diabetes.  Role Documenting the Problem One  [place] for Problem One  Active  [facility] Long Term Goal   Patient will report maintaining Hgb A1C below 7.5 in the next 90 days.  THN Long Term Goal Start Date  09/09/18  Interventions for Problem One Long Term Goal  Reviewed and discussed current care plan and goals with patient, encourage to rescheduled cancelled primary care appointment when possible, reviewed medications and encouraged medications compliance, reviewed and encouraged diabetic meal options, reviewed current fasting blood sugar ranges and ways to help reduce them, encouraged patient to incrase activity as tolerated  to assist with lowering A1C, reviewed signs and symptoms of COVID 19 and encouraged patient to notify provider if she develops symptoms, discussed social distancing and disinfecting versus cleaning     Appointments:  Last appointment with primary care provider, Dr. Jeryl Columbia was 04/18/2018.  February appointment was cancelled by provider and patient is planning to reschedule as soon as they are able due to the [place] 19 outbreak.  Encouraged to  contact provider to verify if follow up needs to wait or if follow up can be Telehealth appointment.  Plan: RN Health Coach will send primary care provider quarterly update. RN Health Coach will make next telephone outreach to patient within the month of June.  [place] [phone] [email or web address]

## 2018-10-09 ENCOUNTER — Other Ambulatory Visit: Payer: Self-pay | Admitting: *Deleted

## 2018-11-21 ENCOUNTER — Other Ambulatory Visit: Payer: Self-pay | Admitting: Gastroenterology

## 2018-11-22 ENCOUNTER — Other Ambulatory Visit: Payer: Self-pay | Admitting: *Deleted

## 2018-11-22 NOTE — Patient Outreach (Addendum)
[place] [facility]) Care Management  11/22/2018  Mary Colon [birth year] [id number]   RN Health Coach Quarterly Outreach  Referral Date:10/10/2017 Referral Source:Transfer from [place] Management Coordinator Reason for Referral:Chronic Care Improvement Program/Disease Management Education Insurance:Health Team Advantage   Adenddum:  Received telephone call back from patient.  Patient states she is unable to talk at this time and would like to return call back later.  RN Health Coach will await call back from patient.  Outreach Attempt:  Outreach attempt #1 to patient for follow up. No answer. RN Health Coach left HIPAA compliant voicemail message along with contact information.  Plan:  RN Health Coach will make another outreach attempt within the month of July if patient has not transitioned to [facility].  [place] [phone] [email or web address]

## 2018-12-03 ENCOUNTER — Other Ambulatory Visit: Payer: Self-pay | Admitting: *Deleted

## 2018-12-03 NOTE — Patient Outreach (Signed)
[facility]) Care Management  12/03/2018  Mary Colon 08-29-1950 [id number]   Case Closure/Transition to [facility] Health/CCI  Referral Date:10/10/2017 Referral Source:Transfer from [place] Management Coordinator Reason for Referral:Chronic Care Improvement Program/Disease Management Education Insurance:Health Team Advantage   Outreach:  Patient case has been transitioned to [facility] Health/CCI for further Care Management assistance.  Plan: RN Health Coach will close case at this time. RN Health Coach will send primary care provider Care Management Case Closure Letter. RN Health Coach will make patient inactive with [facility] Care Management at this time.  [place] [phone] [email or web address] [email or web address]

## 2018-12-09 ENCOUNTER — Other Ambulatory Visit: Payer: Self-pay | Admitting: *Deleted

## 2018-12-09 NOTE — Patient Outreach (Signed)
[place] [facility]) Care Management  12/09/2018  Hayven Croy [birth year] [id number]   Case Closure/Transition to [facility] Health/CCI  Referral Date:10/10/2017 Referral Source:Transfer from [place] Management Coordinator Reason for Referral:Chronic Care Improvement Program/Disease Management Education Insurance:Health Team Advantage   Outreach Attempt:  Received telephone call back from patient from previous voicemail messages left.  HIPAA verified with patient.  RN Health Coach informed patient her case has been transferred over to [facility] Health/CCI and they will now be contacting patient.  Stated her understanding.  Patient is requesting new pill box for medication management.  Plan:  RN Health Coach will send patient pill box per her request.  [place] informed patient of transfer of services to [facility] Health/CCI and encouraged patient to contact [place] with questions or concerns or to get contact information for [facility].  [place] [phone] [email or web address]

## 2018-12-24 DIAGNOSIS — I10 Essential (primary) hypertension: Secondary | ICD-10-CM | POA: Diagnosis not present

## 2018-12-24 DIAGNOSIS — M542 Cervicalgia: Secondary | ICD-10-CM | POA: Diagnosis not present

## 2018-12-27 DIAGNOSIS — I10 Essential (primary) hypertension: Secondary | ICD-10-CM | POA: Diagnosis not present

## 2018-12-27 DIAGNOSIS — M542 Cervicalgia: Secondary | ICD-10-CM | POA: Diagnosis not present

## 2018-12-30 DIAGNOSIS — I10 Essential (primary) hypertension: Secondary | ICD-10-CM | POA: Diagnosis not present

## 2018-12-30 DIAGNOSIS — M542 Cervicalgia: Secondary | ICD-10-CM | POA: Diagnosis not present

## 2019-01-01 DIAGNOSIS — M542 Cervicalgia: Secondary | ICD-10-CM | POA: Diagnosis not present

## 2019-01-03 ENCOUNTER — Other Ambulatory Visit: Payer: Self-pay | Admitting: Interventional Radiology

## 2019-01-03 DIAGNOSIS — I83813 Varicose veins of bilateral lower extremities with pain: Secondary | ICD-10-CM

## 2019-01-06 DIAGNOSIS — M542 Cervicalgia: Secondary | ICD-10-CM | POA: Diagnosis not present

## 2019-01-13 DIAGNOSIS — M542 Cervicalgia: Secondary | ICD-10-CM | POA: Diagnosis not present

## 2019-01-15 DIAGNOSIS — M542 Cervicalgia: Secondary | ICD-10-CM | POA: Diagnosis not present

## 2019-01-20 DIAGNOSIS — M542 Cervicalgia: Secondary | ICD-10-CM | POA: Diagnosis not present

## 2019-01-21 ENCOUNTER — Ambulatory Visit
Admission: RE | Admit: 2019-01-21 | Discharge: 2019-01-21 | Disposition: A | Discharge status: Home / Self Care | Payer: PPO | Source: Ambulatory Visit | Attending: Interventional Radiology | Admitting: Interventional Radiology

## 2019-01-21 DIAGNOSIS — I83813 Varicose veins of bilateral lower extremities with pain: Secondary | ICD-10-CM

## 2019-01-21 DIAGNOSIS — R252 Cramp and spasm: Secondary | ICD-10-CM | POA: Diagnosis not present

## 2019-01-21 NOTE — Consult Note (Signed)
Chief Complaint: Leg cramping and varicose veins  Referring Physician(s): Patient Self-referral  PCP: Mary Colon in [place]  History of Present Illness: Mary Colon is a 68 y.o. female presenting as a scheduled consultation to [facility], self-referred.   She tells me that for a couple of years now she has been having worsening leg cramping in the left calf and thigh, and less so on the right.  She has noticed some varicose veins and spider veins, and had a friend tell her about VIR services for this symptoms/complaint.  She  Called our office.   She denies any history of DVT/PE.  She has not had any recent hospitalizations. She has no orthopnea.  She does not have history of MI or CHF.  She has been treated for DM2, but now has been taken of metformin secondary to better activity level and diet.  She is non-smoker.   She reports occasional "rest-less legs" syndrome.  Her leg cramping is typically worst at the end of the day.  Improved by morning.    She is very active with her family, her church, and at home.  She sings in the choir, supports her church, currently making facemasks, and does a lot of baking at home.  She remains active with her grandchildren.   She has never worn compression stockings.   Duplex today is negative for DVT, negative for any significant reflux of the right or left GSV/SSV.    Past Medical History:  Diagnosis Date  . Abnormal colonoscopy 02/22/2015  . Diabetes mellitus without complication ([place])   . Diabetic eye exam ([place]) 10/10/2015   no retinopathy  . H/O mammogram 12/01/2015   normal  . History of pneumonia   . History of UTI   . Hypertension   . Microalbuminuria absent 03/20/2017    Past Surgical History:  Procedure Laterality Date  . COLONOSCOPY  02/22/2015   Colonic polyp status post polypectomy. Moderate predominantly sigmoid diverticulosis. Tubular adenoma.   .  ESOPHAGOGASTRODUODENOSCOPY  09/16/2014   Mild gastrtis.     Allergies: Codeine, Hydrocodone, Oxycodone, Azithromycin, and Methylprednisolone  Medications: Prior to Admission medications   Medication Sig Start Date End Date Taking? Authorizing Provider  FREESTYLE LITE test strip USE TO TEST BLOOD SUGAR BID 11/23/15   [provider]  glucosamine-chondroitin 500-400 MG tablet Take by mouth.    [provider]  loratadine (CLARITIN) 10 MG tablet loratadine 10 mg tablet  TK 1 T PO D    [provider]  Nutritional Supplements (JUICE PLUS FIBRE PO) Take 1 capsule by mouth daily.    [provider]  pantoprazole (PROTONIX) 40 MG tablet TAKE 1 TABLET(40 MG) BY MOUTH DAILY 11/21/18   Jackquline Denmark, MD  sennosides-docusate sodium (SENOKOT-S) 8.6-50 MG tablet Take 1 tablet by mouth daily.    [provider]     Family History  Problem Relation Age of Onset  . Cancer Sister        myoxid lipo sarcoma    Social History   Socioeconomic History  . Marital status: Married    Spouse name: Not on file  . Number of children: Not on file  . Years of education: Not on file  . Highest education level: Not on file  Occupational History  . Not on file  Social Needs  . Financial resource strain: Not on file  . Food insecurity    Worry: Not on file    Inability: Not  on file  . Transportation needs    Medical: Not on file    Non-medical: Not on file  Tobacco Use  . Smoking status: Never Smoker  . Smokeless tobacco: Never Used  Substance and Sexual Activity  . Alcohol use: Never    Frequency: Never  . Drug use: Never  . Sexual activity: Not on file  Lifestyle  . Physical activity    Days per week: Not on file    Minutes per session: Not on file  . Stress: Not on file  Relationships  . Social Herbalist on phone: Not on file    Gets together: Not on file    Attends religious service: Not on file    Active member of club or  organization: Not on file    Attends meetings of clubs or organizations: Not on file    Relationship status: Not on file  Other Topics Concern  . Not on file  Social History Narrative  . Not on file       Review of Systems: A 12 point ROS discussed and pertinent positives are indicated in the HPI above.  All other systems are negative.  Review of Systems  Vital Signs: Pulse 77   Temp 98.1 F (36.7 C)   SpO2 97%   Physical Exam General: 67 yo female appearing stated age.  Well-developed, well-nourished.  No distress. HEENT: Atraumatic, normocephalic.  Conjugate gaze, extra-ocular motor intact. No scleral icterus or scleral injection. No lesions on external ears, nose, lips, or gums.  Oral mucosa moist, pink.  Neck: Symmetric with no goiter enlargement.  Chest/Lungs:  Symmetric chest with inspiration/expiration.  No labored breathing.    Heart:    No JVD appreciated.  Abdomen:  Soft, NT/ND, with + bowel sounds.   Genito-urinary: Deferred Neurologic: Alert & Oriented to person, place, and time.   Normal affect and insight.  Appropriate questions.  Moving all 4 extremities with gross sensory intact.  Extremities:Small telangiectasia of the bilateral lower extremity.  No swelling/pitting edema.  No rubor or lipodermatosclerosis.  No wounds.  Minimal varicose veins of the right posterior calf, with mild bulge.     Imaging: No results found.  Labs:  CBC: No results for input(s): WBC, HGB, HCT, PLT in the last 8760 hours.  COAGS: No results for input(s): INR, APTT in the last 8760 hours.  BMP: No results for input(s): NA, K, CL, CO2, GLUCOSE, BUN, CALCIUM, CREATININE, GFRNONAA, GFRAA in the last 8760 hours.  Invalid input(s): CMP  LIVER FUNCTION TESTS: No results for input(s): BILITOT, AST, ALT, ALKPHOS, PROT, ALBUMIN in the last 8760 hours.  TUMOR MARKERS: No results for input(s): AFPTM, CEA, CA199, CHROMGRNA in the last 8760 hours.  Assessment and Plan:  [name] is  a 68 year old female presenting today for evaluation of lower extremity cramping, telangiectasias, and varicose veins.   She is CEAP-1 category bilateral.   Our duplex exam today shows no significant reflux of the bilateral GSV/SSV.  She has small perforators bilateral.    I had a lengthy discussion with her today regarding the anatomy/pathology of venous disease, and our treatment options of venous ablation.  I did let her know that with the absence of reflux, I do not believe our ablation will improve her complaint of cramping.   I did let her know that our office does not do any targeted vein therapy for cosmetic result.  I did give her the name of our local vein  treatment office in [place], and Dr. Elza Rafter.    I answered all of her questions.   Plan: - We will defer any treatment for now, and she will be reaching out to our colleagues at [facility] and [facility].      Electronically Signed: Corrie Mckusick 01/21/2019, 9:24 AM   I spent a total of  60 Minutes   in face to face in clinical consultation, greater than 50% of which was counseling/coordinating care for leg cramping CEAP-1 vein disease.

## 2019-01-22 DIAGNOSIS — M542 Cervicalgia: Secondary | ICD-10-CM | POA: Diagnosis not present

## 2019-01-28 DIAGNOSIS — M542 Cervicalgia: Secondary | ICD-10-CM | POA: Diagnosis not present

## 2019-02-12 DIAGNOSIS — M542 Cervicalgia: Secondary | ICD-10-CM | POA: Diagnosis not present

## 2019-03-11 DIAGNOSIS — E1165 Type 2 diabetes mellitus with hyperglycemia: Secondary | ICD-10-CM | POA: Diagnosis not present

## 2019-03-11 DIAGNOSIS — Z1331 Encounter for screening for depression: Secondary | ICD-10-CM | POA: Diagnosis not present

## 2019-03-11 DIAGNOSIS — Z6830 Body mass index (BMI) 30.0-30.9, adult: Secondary | ICD-10-CM | POA: Diagnosis not present

## 2019-03-11 DIAGNOSIS — Z79899 Other long term (current) drug therapy: Secondary | ICD-10-CM | POA: Diagnosis not present

## 2019-03-11 DIAGNOSIS — I10 Essential (primary) hypertension: Secondary | ICD-10-CM | POA: Diagnosis not present

## 2019-03-11 DIAGNOSIS — E785 Hyperlipidemia, unspecified: Secondary | ICD-10-CM | POA: Diagnosis not present

## 2019-05-06 ENCOUNTER — Institutional Professional Consult (permissible substitution): Payer: PPO | Admitting: Plastic Surgery

## 2019-05-06 DIAGNOSIS — Z6831 Body mass index (BMI) 31.0-31.9, adult: Secondary | ICD-10-CM | POA: Diagnosis not present

## 2019-05-06 DIAGNOSIS — H00019 Hordeolum externum unspecified eye, unspecified eyelid: Secondary | ICD-10-CM | POA: Diagnosis not present

## 2019-05-06 DIAGNOSIS — H1032 Unspecified acute conjunctivitis, left eye: Secondary | ICD-10-CM | POA: Diagnosis not present

## 2019-05-20 DIAGNOSIS — Z6831 Body mass index (BMI) 31.0-31.9, adult: Secondary | ICD-10-CM | POA: Diagnosis not present

## 2019-05-20 DIAGNOSIS — H1032 Unspecified acute conjunctivitis, left eye: Secondary | ICD-10-CM | POA: Diagnosis not present

## 2019-05-20 DIAGNOSIS — H00019 Hordeolum externum unspecified eye, unspecified eyelid: Secondary | ICD-10-CM | POA: Diagnosis not present

## 2019-08-22 DIAGNOSIS — Z1231 Encounter for screening mammogram for malignant neoplasm of breast: Secondary | ICD-10-CM | POA: Diagnosis not present

## 2019-10-24 ENCOUNTER — Institutional Professional Consult (permissible substitution): Payer: PPO | Admitting: Plastic Surgery

## 2019-10-31 ENCOUNTER — Other Ambulatory Visit: Payer: Self-pay

## 2019-10-31 ENCOUNTER — Encounter: Payer: Self-pay | Admitting: Plastic Surgery

## 2019-10-31 ENCOUNTER — Ambulatory Visit (INDEPENDENT_AMBULATORY_CARE_PROVIDER_SITE_OTHER): Payer: Self-pay | Admitting: Plastic Surgery

## 2019-10-31 DIAGNOSIS — Z719 Counseling, unspecified: Secondary | ICD-10-CM | POA: Insufficient documentation

## 2019-10-31 NOTE — Progress Notes (Signed)
The patient is a 69 year old female here for evaluation of spider veins on her legs.  She has a large varicose vein on the posterior right leg but it does not hurt.  She has a medium to small wounds mostly around her knees and her right ankle.  She'd like to see what she can do to have those improved.  She is otherwise in good health.  She has not had any recent illnesses.  I repaired a laceration of her forehead in the past.  This is doing really well.  She may want to consider Botox in the future to make sure it doesn't get scarred down.  I think she would respond to laser and we can get that set up for her.

## 2019-11-12 DIAGNOSIS — J40 Bronchitis, not specified as acute or chronic: Secondary | ICD-10-CM | POA: Diagnosis not present

## 2019-11-12 DIAGNOSIS — Z6831 Body mass index (BMI) 31.0-31.9, adult: Secondary | ICD-10-CM | POA: Diagnosis not present

## 2019-11-12 DIAGNOSIS — J329 Chronic sinusitis, unspecified: Secondary | ICD-10-CM | POA: Diagnosis not present

## 2019-11-12 DIAGNOSIS — J320 Chronic maxillary sinusitis: Secondary | ICD-10-CM | POA: Diagnosis not present

## 2019-11-16 ENCOUNTER — Other Ambulatory Visit: Payer: Self-pay | Admitting: Adult Health

## 2019-11-16 DIAGNOSIS — Z20828 Contact with and (suspected) exposure to other viral communicable diseases: Secondary | ICD-10-CM | POA: Diagnosis not present

## 2019-11-16 DIAGNOSIS — R05 Cough: Secondary | ICD-10-CM | POA: Diagnosis not present

## 2019-11-16 DIAGNOSIS — U071 COVID-19: Secondary | ICD-10-CM

## 2019-11-16 NOTE — Progress Notes (Signed)
  I connected by phone with Makendra Tague on 11/16/2019 at 2:29 PM to discuss the potential use of an new treatment for mild to moderate COVID-19 viral infection in non-hospitalized patients.  This patient is a 69 y.o. female that meets the FDA criteria for Emergency Use Authorization of bamlanivimab/etesevimab or casirivimab/imdevimab.  Has a (+) direct SARS-CoV-2 viral test result  Has mild or moderate COVID-19   Is NOT hospitalized due to COVID-19  Is within 10 days of symptom onset  Has at least one of the high risk factor(s) for progression to severe COVID-19 and/or hospitalization as defined in EUA.  Specific high risk criteria : Older age (>/= 69 yo)   I have spoken and communicated the following to the patient or parent/caregiver:  1. FDA has authorized the emergency use of bamlanivimab/etesevimab and casirivimab\imdevimab for the treatment of mild to moderate COVID-19 in adults and pediatric patients with positive results of direct SARS-CoV-2 viral testing who are 63 years of age and older weighing at least 40 kg, and who are at high risk for progressing to severe COVID-19 and/or hospitalization.  2. The significant known and potential risks and benefits of bamlanivimab/etesevimab and casirivimab\imdevimab, and the extent to which such potential risks and benefits are unknown.  3. Information on available alternative treatments and the risks and benefits of those alternatives, including clinical trials.  4. Patients treated with bamlanivimab/etesevimab and casirivimab\imdevimab should continue to self-isolate and use infection control measures (e.g., wear mask, isolate, social distance, avoid sharing personal items, clean and disinfect "high touch" surfaces, and frequent handwashing) according to CDC guidelines.   5. The patient or parent/caregiver has the option to accept or refuse bamlanivimab/etesevimab or casirivimab\imdevimab .  After reviewing this information with the patient,  The patient agreed to proceed with receiving the bamlanimivab infusion and will be provided a copy of the Fact sheet prior to receiving the infusion.[place] 11/16/2019 2:29 PM

## 2019-11-17 ENCOUNTER — Ambulatory Visit (HOSPITAL_COMMUNITY)
Admission: RE | Admit: 2019-11-17 | Discharge: 2019-11-17 | Disposition: A | Discharge status: Home / Self Care | Payer: Medicare Other | Source: Ambulatory Visit | Attending: Pulmonary Disease | Admitting: Pulmonary Disease

## 2019-11-17 DIAGNOSIS — Z23 Encounter for immunization: Secondary | ICD-10-CM | POA: Diagnosis not present

## 2019-11-17 DIAGNOSIS — U071 COVID-19: Secondary | ICD-10-CM | POA: Diagnosis present

## 2019-11-17 MED ORDER — EPINEPHRINE 0.3 MG/0.3ML IJ SOAJ: 0.3000 mg | Freq: Once | INTRAMUSCULAR | Status: DC | PRN

## 2019-11-17 MED ORDER — FAMOTIDINE IN NACL 20-0.9 MG/50ML-% IV SOLN: 20.0000 mg | Freq: Once | INTRAVENOUS | Status: DC | PRN

## 2019-11-17 MED ORDER — METHYLPREDNISOLONE SODIUM SUCC 125 MG IJ SOLR: 125.0000 mg | Freq: Once | INTRAMUSCULAR | Status: DC | PRN

## 2019-11-17 MED ORDER — ALBUTEROL SULFATE HFA 108 (90 BASE) MCG/ACT IN AERS: 2.0000 | INHALATION_SPRAY | Freq: Once | RESPIRATORY_TRACT | Status: DC | PRN

## 2019-11-17 MED ORDER — DIPHENHYDRAMINE HCL 50 MG/ML IJ SOLN: 50.0000 mg | Freq: Once | INTRAMUSCULAR | Status: DC | PRN

## 2019-11-17 MED ORDER — SODIUM CHLORIDE 0.9 % IV SOLN: INTRAVENOUS | Status: DC | PRN

## 2019-11-17 MED ORDER — SODIUM CHLORIDE 0.9 % IV SOLN
Freq: Once | INTRAVENOUS | Status: AC
  Filled 2019-11-17: qty 700

## 2019-11-17 NOTE — Progress Notes (Signed)
°  Diagnosis: COVID-19  Physician:Dr Joya Gaskins   Procedure: [facility] Med: bamlanivimab\etesevimab infusion - Provided patient with bamlanimivab\etesevimab fact sheet for patients, parents and caregivers prior to infusion.  Complications: No immediate complications noted.  Discharge: Discharged home   Dewaine Oats 11/17/2019

## 2019-11-17 NOTE — Discharge Instructions (Signed)

## 2019-12-26 ENCOUNTER — Encounter: Payer: Self-pay | Admitting: Plastic Surgery

## 2019-12-26 ENCOUNTER — Ambulatory Visit (INDEPENDENT_AMBULATORY_CARE_PROVIDER_SITE_OTHER): Payer: Self-pay | Admitting: Plastic Surgery

## 2019-12-26 ENCOUNTER — Other Ambulatory Visit: Payer: Self-pay

## 2019-12-26 VITALS — BP 160/95 | HR 76 | Temp 98.7°F

## 2019-12-26 DIAGNOSIS — Z719 Counseling, unspecified: Secondary | ICD-10-CM

## 2019-12-26 NOTE — Progress Notes (Signed)
Preoperative Dx: Leg veins bilaterally  Postoperative Dx:  same  Procedure: laser to leg veins  Anesthesia: none  Description of Procedure:  Risks and complications were explained to the patient. Consent was confirmed and signed. Time out was called and all information was confirmed to be correct. The area  area was prepped with alcohol and wiped dry. The NdYag laser was set at 160.1. The leg veins were lasered. The patient tolerated the procedure well and there were no complications. The patient is to follow up in 4 weeks.

## 2020-02-06 ENCOUNTER — Ambulatory Visit (INDEPENDENT_AMBULATORY_CARE_PROVIDER_SITE_OTHER): Payer: Self-pay | Admitting: Plastic Surgery

## 2020-02-06 ENCOUNTER — Encounter: Payer: Self-pay | Admitting: Plastic Surgery

## 2020-02-06 ENCOUNTER — Other Ambulatory Visit: Payer: Self-pay

## 2020-02-06 VITALS — BP 150/88 | HR 77 | Temp 99.0°F

## 2020-02-06 DIAGNOSIS — Z9181 History of falling: Secondary | ICD-10-CM | POA: Diagnosis not present

## 2020-02-06 DIAGNOSIS — Z719 Counseling, unspecified: Secondary | ICD-10-CM

## 2020-02-06 DIAGNOSIS — Z6831 Body mass index (BMI) 31.0-31.9, adult: Secondary | ICD-10-CM | POA: Diagnosis not present

## 2020-02-06 DIAGNOSIS — M79675 Pain in left toe(s): Secondary | ICD-10-CM | POA: Diagnosis not present

## 2020-02-06 DIAGNOSIS — B070 Plantar wart: Secondary | ICD-10-CM | POA: Diagnosis not present

## 2020-02-06 DIAGNOSIS — B351 Tinea unguium: Secondary | ICD-10-CM | POA: Diagnosis not present

## 2020-02-06 DIAGNOSIS — M79674 Pain in right toe(s): Secondary | ICD-10-CM | POA: Diagnosis not present

## 2020-02-06 NOTE — Progress Notes (Signed)
Preoperative Dx: Spider veins of legs  Postoperative Dx:  same  Procedure: laser to leg spider veins  Anesthesia: none  Description of Procedure:  Risks and complications were explained to the patient. Consent was confirmed and signed. Time out was called and all information was confirmed to be correct. The area  area was prepped with alcohol and wiped dry. The ND YAG laser was set at 150. The spider veins of legs were lasered per patient request on focused areas mostly around the knees. The patient tolerated the procedure well and there were no complications. The patient is to follow up in 4 weeks.

## 2020-02-19 DIAGNOSIS — B070 Plantar wart: Secondary | ICD-10-CM | POA: Diagnosis not present

## 2020-02-25 ENCOUNTER — Ambulatory Visit: Payer: BLUE CROSS/BLUE SHIELD | Admitting: Sports Medicine

## 2020-03-05 ENCOUNTER — Other Ambulatory Visit: Payer: Self-pay

## 2020-03-05 ENCOUNTER — Encounter: Payer: Self-pay | Admitting: Sports Medicine

## 2020-03-05 ENCOUNTER — Ambulatory Visit: Payer: BLUE CROSS/BLUE SHIELD | Admitting: Sports Medicine

## 2020-03-05 DIAGNOSIS — M79674 Pain in right toe(s): Secondary | ICD-10-CM | POA: Diagnosis not present

## 2020-03-05 DIAGNOSIS — B070 Plantar wart: Secondary | ICD-10-CM | POA: Diagnosis not present

## 2020-03-05 DIAGNOSIS — B351 Tinea unguium: Secondary | ICD-10-CM | POA: Diagnosis not present

## 2020-03-05 DIAGNOSIS — M79675 Pain in left toe(s): Secondary | ICD-10-CM

## 2020-03-05 NOTE — Progress Notes (Signed)
Subjective: Mary Colon is a 69 y.o. female patient who presents to office for evaluation of left foot pain secondary to moderately painful wart at the ball of her foot. Patient has tried PCP treating it and freezing it 2 times without any improvement.  Patient also is concerned about discoloration to the big toenails and reports that she thinks that she may have fungus has recently removed her toenail polish off but reports that the area has been crumbly and brittle at the margins of the great toes for years. Patient denies any other pedal complaints.   Patient Active Problem List   Diagnosis Date Noted  . Encounter for counseling 10/31/2019  . Type 2 diabetes mellitus without complication, without long-term current use of insulin ([place]) 08/10/2017  . Hypercholesterolemia 01/09/2017  . Hypertensive disorder 01/09/2017  . Obesity 01/09/2017  . Laceration of forehead 01/04/2017    Current Outpatient Medications on File Prior to Visit  Medication Sig Dispense Refill  . FREESTYLE LITE test strip USE TO TEST BLOOD SUGAR BID  3  . loratadine (CLARITIN) 10 MG tablet loratadine 10 mg tablet  TK 1 T PO D    . Nutritional Supplements (JUICE PLUS FIBRE PO) Take 1 capsule by mouth daily.    . pantoprazole (PROTONIX) 40 MG tablet TAKE 1 TABLET(40 MG) BY MOUTH DAILY 90 tablet 3  . sennosides-docusate sodium (SENOKOT-S) 8.6-50 MG tablet Take 1 tablet by mouth daily.     Current Facility-Administered Medications on File Prior to Visit  Medication Dose Route Frequency Provider Last Rate Last Admin  . 0.9 %  sodium chloride infusion  500 mL Intravenous Once Jackquline Denmark, MD        Allergies  Allergen Reactions  . Codeine Nausea And Vomiting  . Hydrocodone Nausea And Vomiting  . Oxycodone Nausea And Vomiting  . Azithromycin Rash  . Methylprednisolone Rash    Objective:  General: Alert and oriented x3 in no acute distress  Dermatology: Keratotic lesion present measuring less than 0.5 cm at the  ball of the left foot with no skin lines transversing the lesion, pain is present with medial lateral pressure to the lesion, capillaries with pin point bleeding noted, no webspace macerations, no ecchymosis bilateral, all nails x 10 are short and thick but with most subungual debris noted bilateral hallux.  Vascular: Dorsalis Pedis and Posterior Tibial pedal pulses 1/4, Capillary Fill Time 3 seconds, + pedal hair growth bilateral, no edema bilateral lower extremities, Temperature gradient within normal limits.  Neurology: Gross sensation intact via light touch bilateral.  Musculoskeletal: Mild tenderness with palpation at the lesion site on left, muscular strength 5/5 in all groups without pain or limitation on range of motion.  Assessment and Plan: Problem List Items Addressed This Visit    None    Visit Diagnoses    Nail fungus    -  Primary   Toe pain, bilateral       Plantar wart          -Complete examination performed -Discussed treatment options for wart -Parred keratoic warty lesion left foot using a chisel blade; treated the area with Catharidin covered with bandaid; Advised patient of blistering reaction that will occur from application of medication and once this happens replace bandaid with neosporin and tape/bandaid -Fungal culture was obtained by removing a portion of the hard nail itself from each hallux toenails using a sterile nail nipper and sent to [facility] lab. Patient tolerated the biopsy procedure well without discomfort or need for  anesthesia. -Patient to return to office as scheduled in 3 weeks for review of fungal culture results and for wart check or sooner if condition worsens.  Landis Martins, DPM

## 2020-03-10 NOTE — Addendum Note (Signed)
Addended by: Cranford Mon R on: 03/10/2020 09:11 AM   Modules accepted: Orders

## 2020-03-11 ENCOUNTER — Telehealth: Payer: Self-pay | Admitting: Sports Medicine

## 2020-03-11 NOTE — Telephone Encounter (Signed)
Replace the bandaid every day (once a day) and apply antibiotic cream if there is any signs of blister until her appointment

## 2020-03-11 NOTE — Telephone Encounter (Signed)
Patient wanted to know how long she needs to keep replacing the bandaid with the medication on the place where we wart was.

## 2020-03-12 NOTE — Telephone Encounter (Signed)
Called and spoke with the patient and relayed the message per Dr Stover. Mary Colon °

## 2020-03-26 ENCOUNTER — Encounter: Payer: Self-pay | Admitting: Sports Medicine

## 2020-03-26 ENCOUNTER — Other Ambulatory Visit: Payer: Self-pay

## 2020-03-26 ENCOUNTER — Ambulatory Visit: Payer: PPO | Admitting: Sports Medicine

## 2020-03-26 DIAGNOSIS — B070 Plantar wart: Secondary | ICD-10-CM

## 2020-03-26 DIAGNOSIS — M79675 Pain in left toe(s): Secondary | ICD-10-CM

## 2020-03-26 DIAGNOSIS — M79674 Pain in right toe(s): Secondary | ICD-10-CM

## 2020-03-26 DIAGNOSIS — B351 Tinea unguium: Secondary | ICD-10-CM

## 2020-03-26 NOTE — Progress Notes (Signed)
Subjective: Mary Colon is a 69 y.o. female patient seen today in office for fungal culture results and for follow-up evaluation of left foot wart. Patient has no other pedal complaints at this time.   Patient Active Problem List   Diagnosis Date Noted   Encounter for counseling 10/31/2019   Type 2 diabetes mellitus without complication, without long-term current use of insulin ([place]) 08/10/2017   Hypercholesterolemia 01/09/2017   Hypertensive disorder 01/09/2017   Obesity 01/09/2017   Laceration of forehead 01/04/2017    Current Outpatient Medications on File Prior to Visit  Medication Sig Dispense Refill   FREESTYLE LITE test strip USE TO TEST BLOOD SUGAR BID  3   loratadine (CLARITIN) 10 MG tablet loratadine 10 mg tablet  TK 1 T PO D     Nutritional Supplements (JUICE PLUS FIBRE PO) Take 1 capsule by mouth daily.     pantoprazole (PROTONIX) 40 MG tablet TAKE 1 TABLET(40 MG) BY MOUTH DAILY 90 tablet 3   sennosides-docusate sodium (SENOKOT-S) 8.6-50 MG tablet Take 1 tablet by mouth daily.     Current Facility-Administered Medications on File Prior to Visit  Medication Dose Route Frequency Provider Last Rate Last Admin   0.9 %  sodium chloride infusion  500 mL Intravenous Once Jackquline Denmark, MD        Allergies  Allergen Reactions   Codeine Nausea And Vomiting   Hydrocodone Nausea And Vomiting   Oxycodone Nausea And Vomiting   Azithromycin Rash   Methylprednisolone Rash    Objective: Physical Exam  General: Well developed, nourished, no acute distress, awake, alert and oriented x 3  Vascular: Dorsalis pedis artery 1/4 bilateral, Posterior tibial artery 1/4 bilateral, skin temperature warm to warm proximal to distal bilateral lower extremities, no varicosities, pedal hair present bilateral.  Neurological: Gross sensation present via light touch bilateral.   Dermatological: Skin is warm, dry, and supple bilateral, Nails 1-10 are tender, short thick, and  discolored with mild subungal debris most involved bilateral hallux, no webspace macerations present bilateral, no open lesions present bilateral, no callus/corns/hyperkeratotic tissue present bilateral.  Previous wart is now resolved at left foot.  No signs of infection bilateral.  Musculoskeletal: Bunion deformity noted on right.  Previous bunion surgery noted to left.  Fungal culture: positive for saprophytic fungi  Assessment and Plan:  Problem List Items Addressed This Visit    None    Visit Diagnoses    Nail fungus    -  Primary   Relevant Orders   Hepatic Function Panel   Toe pain, bilateral       Plantar wart       Resolved      -Examined patient -Discussed treatment options for painful mycotic nails -Patient opt for oral Lamisil with full understanding of medication risks; ordered LFTs for review if within normal limits will proceed with sending Rx to pharmacy for lamisil 250mg  PO daily. Anticipate 12 week course.  -Advised good hygiene habits -Wart bottom of left foot is now resolved advised patient that her pain at her big toe joint on the left could be secondary to compensating may try Epson salt or topical pain cream or rub if pain continues we will need to find an x-ray to evaluate this joint on the left at previous area of bunion surgery -Patient to return in 6 weeks for follow up evaluation or sooner if symptoms worsen.  Landis Martins, DPM

## 2020-04-09 ENCOUNTER — Other Ambulatory Visit: Payer: PPO | Admitting: Plastic Surgery

## 2020-04-29 ENCOUNTER — Other Ambulatory Visit: Payer: Self-pay

## 2020-04-29 MED ORDER — PANTOPRAZOLE SODIUM 40 MG PO TBEC: 40.0000 mg | DELAYED_RELEASE_TABLET | Freq: Every day | ORAL | 0 refills | Status: DC

## 2020-05-14 ENCOUNTER — Ambulatory Visit: Payer: PPO | Admitting: Sports Medicine

## 2020-05-21 ENCOUNTER — Other Ambulatory Visit: Payer: Self-pay

## 2020-05-21 ENCOUNTER — Encounter: Payer: Self-pay | Admitting: Plastic Surgery

## 2020-05-21 ENCOUNTER — Ambulatory Visit (INDEPENDENT_AMBULATORY_CARE_PROVIDER_SITE_OTHER): Payer: Self-pay | Admitting: Plastic Surgery

## 2020-05-21 VITALS — BP 157/92 | HR 85 | Temp 98.5°F

## 2020-05-21 DIAGNOSIS — Z719 Counseling, unspecified: Secondary | ICD-10-CM

## 2020-05-21 NOTE — Progress Notes (Signed)
Preoperative Dx: Skin discoloration after MD YAG laser to leg veins  Postoperative Dx:  same  Procedure: laser to leg veins  Anesthesia: none  Description of Procedure:  Risks and complications were explained to the patient. Consent was confirmed and signed. Time out was called and all information was confirmed to be correct. The area  area was prepped with alcohol and wiped dry. The IPL laser was set at 7.2 J/cm2. The leg veins with the hyperpigmentation were lasered. The patient tolerated the procedure well and there were no complications. The patient is to follow up in 4 weeks.  We will see if this shows any improvement.

## 2020-06-01 ENCOUNTER — Other Ambulatory Visit: Payer: Self-pay

## 2020-06-01 ENCOUNTER — Emergency Department (HOSPITAL_COMMUNITY)
Admission: EM | Admit: 2020-06-01 | Discharge: 2020-06-01 | Disposition: A | Discharge status: Home / Self Care | Payer: PPO | Attending: Emergency Medicine | Admitting: Emergency Medicine

## 2020-06-01 ENCOUNTER — Encounter (HOSPITAL_COMMUNITY): Payer: Self-pay | Admitting: Emergency Medicine

## 2020-06-01 DIAGNOSIS — I700 Atherosclerosis of aorta: Secondary | ICD-10-CM | POA: Diagnosis not present

## 2020-06-01 DIAGNOSIS — K921 Melena: Secondary | ICD-10-CM | POA: Insufficient documentation

## 2020-06-01 DIAGNOSIS — Z5321 Procedure and treatment not carried out due to patient leaving prior to being seen by health care provider: Secondary | ICD-10-CM | POA: Diagnosis not present

## 2020-06-01 DIAGNOSIS — R197 Diarrhea, unspecified: Secondary | ICD-10-CM | POA: Diagnosis not present

## 2020-06-01 DIAGNOSIS — R1084 Generalized abdominal pain: Secondary | ICD-10-CM | POA: Diagnosis not present

## 2020-06-01 DIAGNOSIS — K760 Fatty (change of) liver, not elsewhere classified: Secondary | ICD-10-CM | POA: Diagnosis not present

## 2020-06-01 DIAGNOSIS — K529 Noninfective gastroenteritis and colitis, unspecified: Secondary | ICD-10-CM | POA: Diagnosis not present

## 2020-06-01 LAB — CBC
HCT: 47.9 % — ABNORMAL HIGH (ref 36.0–46.0)
Hemoglobin: 16.4 g/dL — ABNORMAL HIGH (ref 12.0–15.0)
MCH: 31.2 pg (ref 26.0–34.0)
MCHC: 34.2 g/dL (ref 30.0–36.0)
MCV: 91.2 fL (ref 80.0–100.0)
Platelets: 274 10*3/uL (ref 150–400)
RBC: 5.25 MIL/uL — ABNORMAL HIGH (ref 3.87–5.11)
RDW: 12.6 % (ref 11.5–15.5)
WBC: 11.6 10*3/uL — ABNORMAL HIGH (ref 4.0–10.5)
nRBC: 0 % (ref 0.0–0.2)

## 2020-06-01 LAB — COMPREHENSIVE METABOLIC PANEL
ALT: 23 U/L (ref 0–44)
AST: 19 U/L (ref 15–41)
Albumin: 4 g/dL (ref 3.5–5.0)
Alkaline Phosphatase: 72 U/L (ref 38–126)
Anion gap: 13 (ref 5–15)
BUN: 8 mg/dL (ref 8–23)
CO2: 23 mmol/L (ref 22–32)
Calcium: 9.3 mg/dL (ref 8.9–10.3)
Chloride: 101 mmol/L (ref 98–111)
Creatinine, Ser: 0.63 mg/dL (ref 0.44–1.00)
GFR, Estimated: >60 mL/min (ref >60)
Glucose, Bld: 338 mg/dL — ABNORMAL HIGH (ref 70–99)
Potassium: 3.7 mmol/L (ref 3.5–5.1)
Sodium: 137 mmol/L (ref 135–145)
Total Bilirubin: 1.1 mg/dL (ref 0.3–1.2)
Total Protein: 6.8 g/dL (ref 6.5–8.1)

## 2020-06-01 LAB — URINALYSIS, ROUTINE W REFLEX MICROSCOPIC
Bacteria, UA: NONE SEEN
Bilirubin Urine: NEGATIVE
Glucose, UA: >=500 mg/dL — AB
Hgb urine dipstick: NEGATIVE
Ketones, ur: 20 mg/dL — AB
Leukocytes,Ua: NEGATIVE
Nitrite: NEGATIVE
Protein, ur: 30 mg/dL — AB
Specific Gravity, Urine: 1.035 — ABNORMAL HIGH (ref 1.005–1.030)
pH: 5 (ref 5.0–8.0)

## 2020-06-01 LAB — LIPASE, BLOOD: Lipase: 23 U/L (ref 11–51)

## 2020-06-01 NOTE — ED Notes (Signed)
Pt told staff that she is leaving due to wait time. Staff asked her to stay and be seen, pt still decided to leave.

## 2020-06-01 NOTE — ED Triage Notes (Signed)
Sent from dr in [place] Colon for possible bowel obstruction-- has hx of diverticulosis -- having blood in stool currently, diarrhea -- x multiple times- unable to count.

## 2020-06-14 ENCOUNTER — Ambulatory Visit: Payer: PPO | Admitting: Gastroenterology

## 2020-06-14 ENCOUNTER — Encounter: Payer: Self-pay | Admitting: Gastroenterology

## 2020-06-14 VITALS — BP 138/88 | HR 92 | Ht 66.0 in | Wt 192.5 lb

## 2020-06-14 DIAGNOSIS — K529 Noninfective gastroenteritis and colitis, unspecified: Secondary | ICD-10-CM | POA: Diagnosis not present

## 2020-06-14 DIAGNOSIS — Z8719 Personal history of other diseases of the digestive system: Secondary | ICD-10-CM | POA: Diagnosis not present

## 2020-06-14 DIAGNOSIS — Z86010 Personal history of colonic polyps: Secondary | ICD-10-CM

## 2020-06-14 NOTE — Patient Instructions (Signed)
If you are age 70 or older, your body mass index should be between 23-30. Your Body mass index is 31.07 kg/m. If this is out of the aforementioned range listed, please consider follow up with your Primary Care Provider.  If you are age 29 or younger, your body mass index should be between 19-25. Your Body mass index is 31.07 kg/m. If this is out of the aformentioned range listed, please consider follow up with your Primary Care Provider.   Due to recent changes in healthcare laws, you may see the results of your imaging and laboratory studies on MyChart before your provider has had a chance to review them.  We understand that in some cases there may be results that are confusing or concerning to you. Not all laboratory results come back in the same time frame and the provider may be waiting for multiple results in order to interpret others.  Please give [place] 48 hours in order for your provider to thoroughly review all the results before contacting the office for clarification of your results. \   Follow up as needed. It was a pleasure to see you today!  Lynann Bologna, M.D.

## 2020-06-14 NOTE — Progress Notes (Signed)
Chief Complaint: FU    ASSESSMENT AND PLAN;   #1.  Recent right sided colitis likely acute gastroenteritis 05/2020 (resolved with Cipro/Flagyl)   #2. H/O Diverticular abscess (08/2017, Resolved) - First episode of acute diverticulitis, complicated by 2.7 cm, managed conservatively.   #3. H/O colonic polyps-tubular adenomas. Next colon 12/2022   Plan: -Continue current treatment. -FU as needed      HPI:    Mary Colon is a 70 y.o. female  FU from ED visit 12/21  To [facility] ED with abdominal pain, diarrhea.  Labs showed normal CBC but with left shift.  Underwent CT Abdo/pelvis which showed left-sided colitis.  She was given Cipro/Flagyl x 7 days with good results.  She denies having any further problems.  Currently, no diarrhea or constipation.  She feels much better.  No further abdominal pain.  No fever or chills.  Her most recent colonoscopy was on 12/2017 which showed mod sigmoid diverticulosis.  It was otherwise unremarkable.  She was advised to get repeat colonoscopy in 5 years due to significant previous history of colonic polyps.    From previous records: Adm August 10, 2017 at [facility] with acute sig diverticulitis with 2.7 cm div abscess, managed conservatively with IV antibiotics. Seen by Sx. Pt opted for nonoperative management.  Has done well.  Past GI procedures: -Colonoscopy 12/2017- ([facility]) mod sigmoid diverticulosis -EGD 09/16/2014-mild gastritis with negative CLOtest, negative small bowel biopsies.   Past Medical History:  Diagnosis Date  . Abnormal colonoscopy 02/22/2015  . Diabetes mellitus without complication ([place])   . Diabetic eye exam ([place]) 10/10/2015   no retinopathy  . H/O mammogram 12/01/2015   normal  . History of pneumonia   . History of UTI   . Hypertension   . Microalbuminuria absent 03/20/2017    Past Surgical History:  Procedure Laterality Date  . COLONOSCOPY  02/22/2015   Colonic polyp status post polypectomy. Moderate predominantly sigmoid  diverticulosis. Tubular adenoma.   . ESOPHAGOGASTRODUODENOSCOPY  09/16/2014   Mild gastrtis.     Family History  Problem Relation Age of Onset  . Cancer Sister        myoxid lipo sarcoma  . Heart disease Mother   . Diabetes Mother   . Heart disease Father     Social History   Tobacco Use  . Smoking status: Never Smoker  . Smokeless tobacco: Never Used  Vaping Use  . Vaping Use: Never used  Substance Use Topics  . Alcohol use: Never  . Drug use: Never    Current Outpatient Medications  Medication Sig Dispense Refill  . fluconazole (DIFLUCAN) 150 MG tablet Take 150 mg by mouth as directed.    Marland Kitchen FREESTYLE LITE test strip USE TO TEST BLOOD SUGAR BID  3  . loratadine (CLARITIN) 10 MG tablet loratadine 10 mg tablet  TK 1 T PO D    . Nutritional Supplements (JUICE PLUS FIBRE PO) Take 1 capsule by mouth daily.    . pantoprazole (PROTONIX) 40 MG tablet Take 1 tablet (40 mg total) by mouth daily. 90 tablet 0  . sennosides-docusate sodium (SENOKOT-S) 8.6-50 MG tablet Take 1 tablet by mouth daily.     Current Facility-Administered Medications  Medication Dose Route Frequency Provider Last Rate Last Admin  . 0.9 %  sodium chloride infusion  500 mL Intravenous Once Jackquline Denmark, MD        Allergies  Allergen Reactions  . Codeine Nausea And Vomiting  . Hydrocodone Nausea And Vomiting  .  Oxycodone Nausea And Vomiting  . Azithromycin Rash  . Methylprednisolone Rash    Review of Systems:  neg     Physical Exam:    BP 138/88   Pulse 92   Ht 5\' 6"  (1.676 m)   Wt 192 lb 8 oz (87.3 kg)   LMP  (LMP Unknown)   BMI 31.07 kg/m  Filed Weights   06/14/20 1402  Weight: 192 lb 8 oz (87.3 kg)   Constitutional:  Well-developed, in no acute distress. Psychiatric: Normal mood and affect. Behavior is normal. HEENT: Pupils normal.  Conjunctivae are normal. No scleral icterus. Cardiovascular: Normal rate, regular rhythm. No edema Pulmonary/chest: Effort normal and breath sounds  normal. No wheezing, rales or rhonchi. Abdominal: Soft, nondistended. Nontender. Bowel sounds active throughout. There are no masses palpable. No hepatomegaly. Rectal:  defered Neurological: Alert and oriented to person place and time. Skin: Skin is warm and dry. No rashes noted.  Seen in presence of patient's husband.   08/12/20, MD 06/14/2020, 2:22 PM

## 2020-06-19 IMAGING — US VENOUS DOPPLER ULTRASOUND OF  LOWER EXTREMITIES
1 series · 12 of 24 positions shown · non-contrast
Comparison: None.

CLINICAL DATA: 68-year-old female with a history of lower extremity
cramping and possible chronic venous insufficiency



[Series 1: venous doppler ultrasound of lower extremities · 0.08mm/px · 12 of 58 slices shown]
[im 3/58]
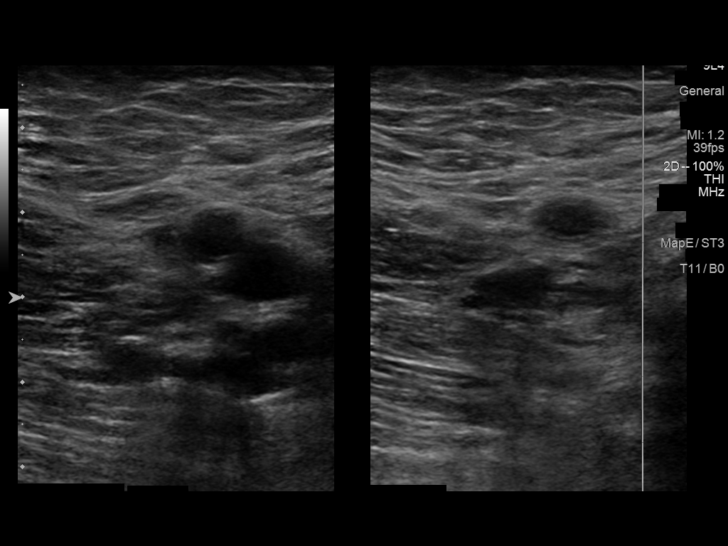
[im 8/58]
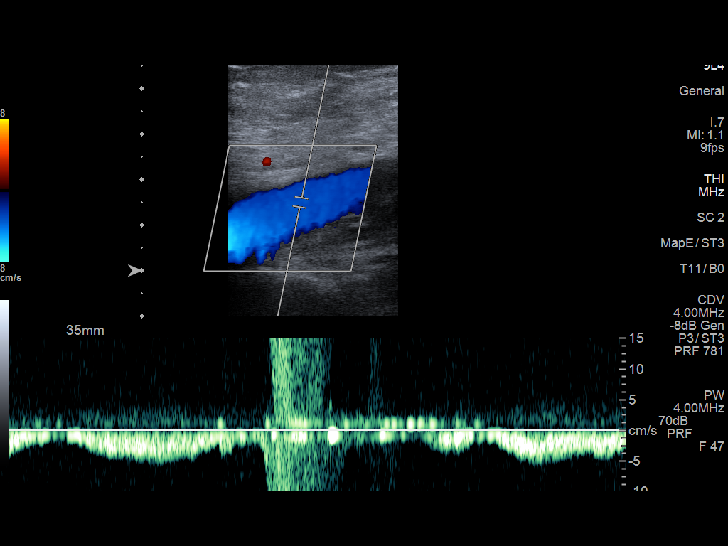
[im 13/58]
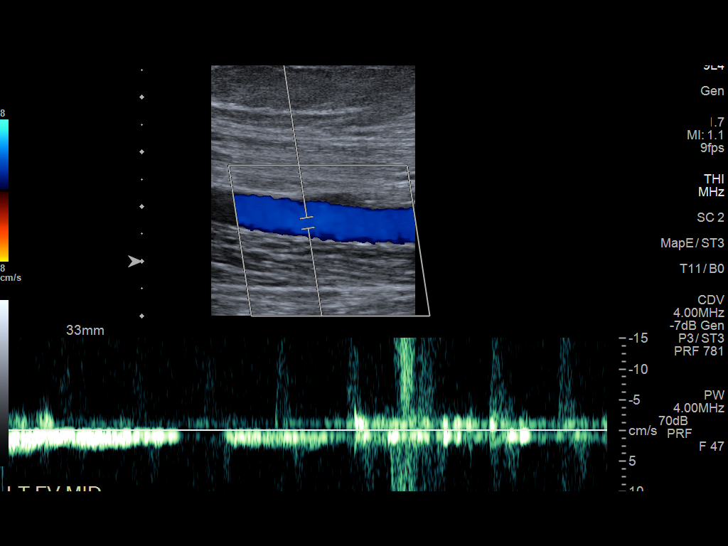
[im 18/58]
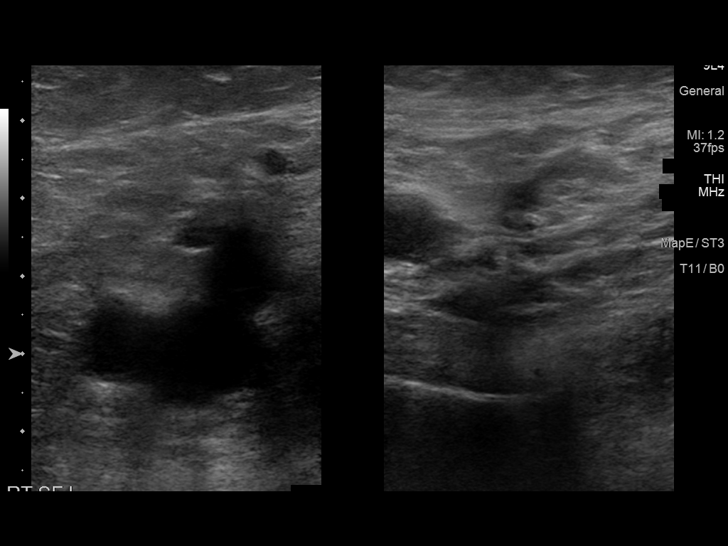
[im 23/58]
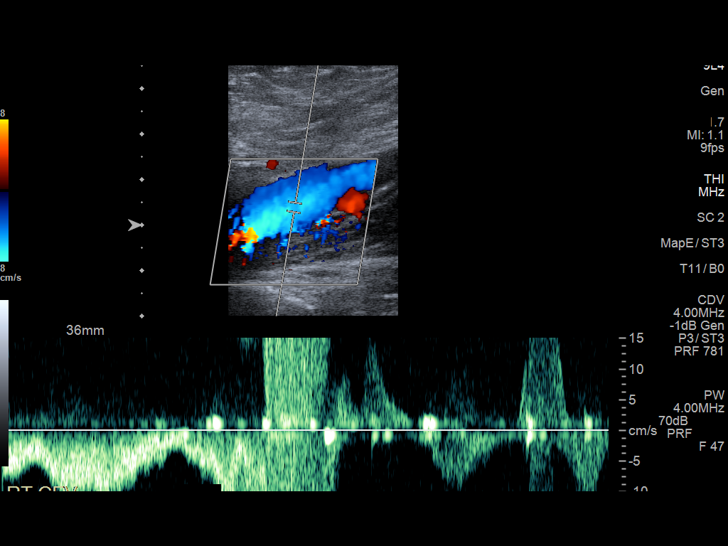
[im 28/58]
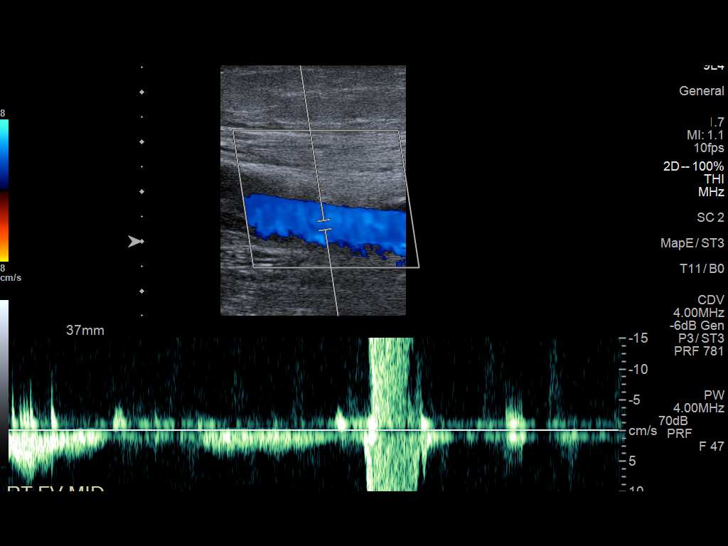
[im 33/58]
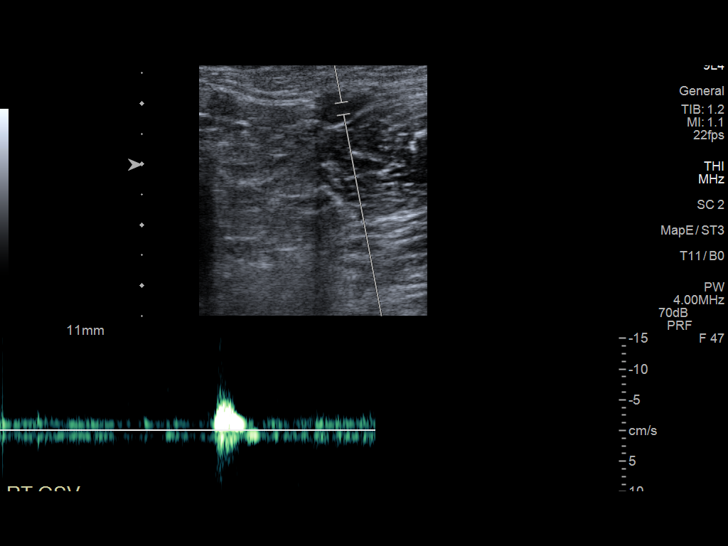
[im 38/58]
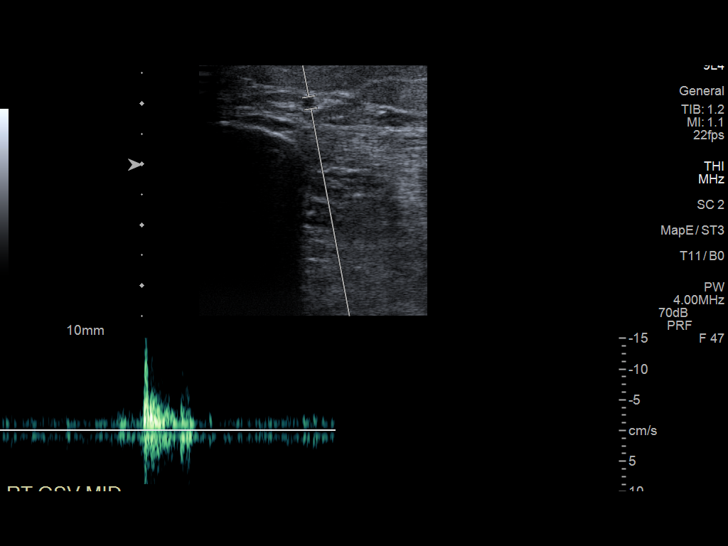
[im 43/58]
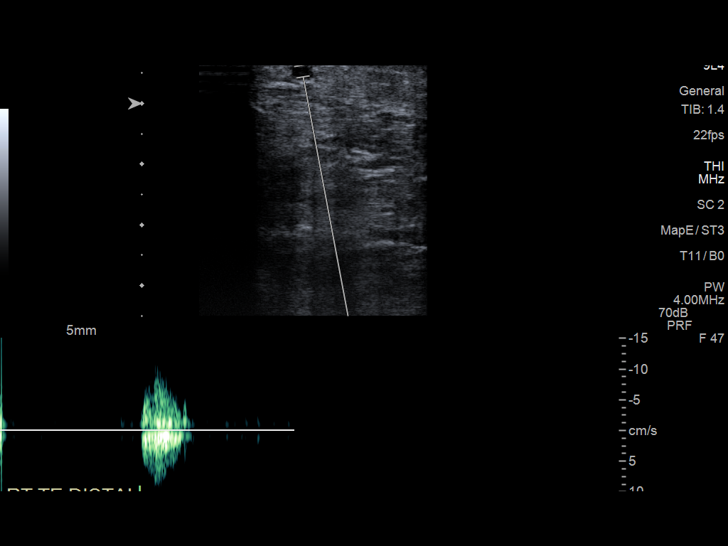
[im 48/58]
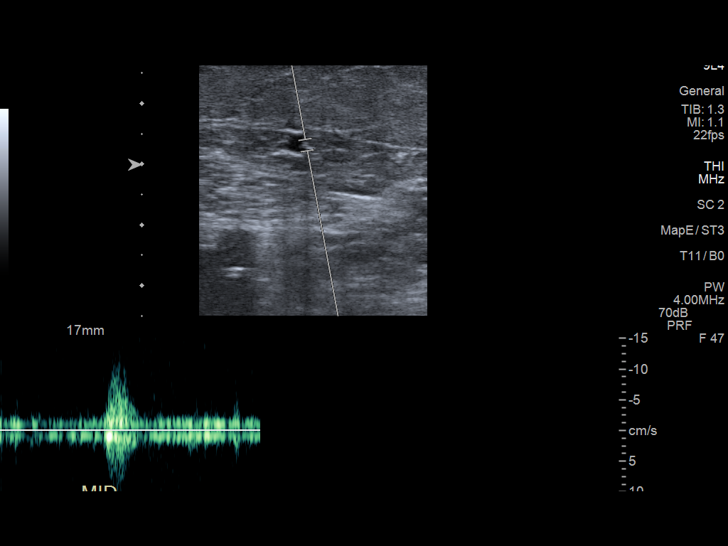
[im 53/58]
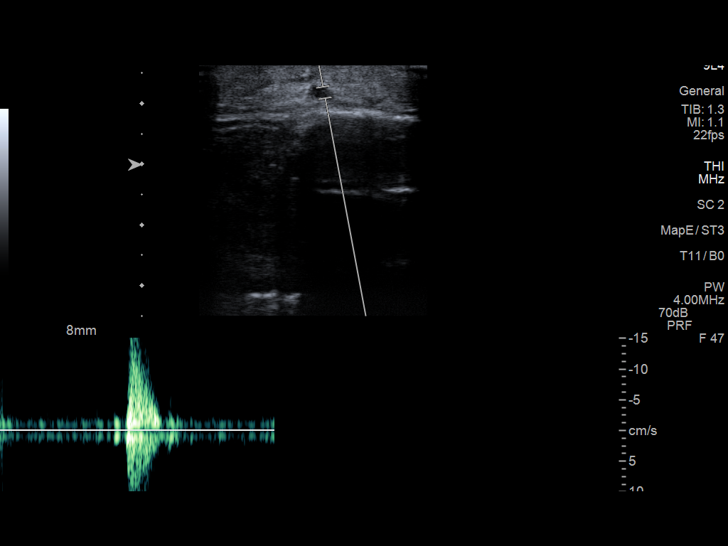
[im 58/58]
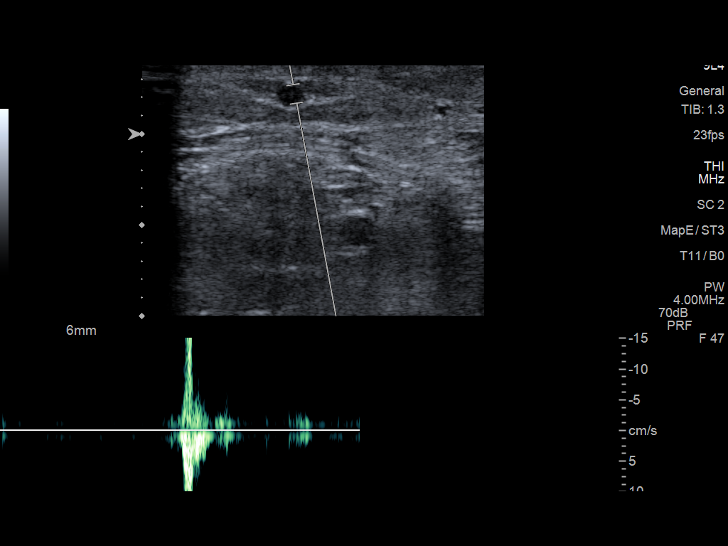

[12 of 24 positions shown; findings below may reference images not displayed]

FINDINGS: RIGHT LOWER EXTREMITY

Common Femoral Vein: No evidence of thrombus. Normal
compressibility, respiratory phasicity and response to augmentation.

Saphenofemoral Junction: No evidence of thrombus. Normal
compressibility and flow on color Doppler imaging.

Profunda Femoral Vein: No evidence of thrombus. Normal
compressibility and flow on color Doppler imaging.

Femoral Vein: No evidence of thrombus. Normal compressibility,
respiratory phasicity and response to augmentation.

Popliteal Vein: No evidence of thrombus. Normal compressibility,
respiratory phasicity and response to augmentation.

Calf Veins: No evidence of thrombus. Normal compressibility and flow
on color Doppler imaging.

GSV at SFJ:  No reflux

GSV at Proximal Thigh:  No reflux

GSV at Mid Thigh:  No reflux

GSV at Distal Thigh:  No reflux

GSV at Prox Calf:  No reflux

GSV at Mid Calf:  No reflux

GSV at Distal Calf:  No reflux

SSV at Sapheno popliteal junction: No junction identified. Patient
has a thigh extender with the small saphenous vein extending of the
posterior thigh.

SSV at mid calf:  No reflux

SSF at distal calf:  No reflux

Small perforator in the right mid posterior calf associated with
superficial varicose vein. This measures 2 mm with no measurable
reflux.

Other Findings:  None.

LEFT LOWER EXTREMITY

Common Femoral Vein: No evidence of thrombus. Normal
compressibility, respiratory phasicity and response to augmentation.

Saphenofemoral Junction: No evidence of thrombus. Normal
compressibility and flow on color Doppler imaging.

Profunda Femoral Vein: No evidence of thrombus. Normal
compressibility and flow on color Doppler imaging.

Femoral Vein: No evidence of thrombus. Normal compressibility,
respiratory phasicity and response to augmentation.

Popliteal Vein: No evidence of thrombus. Normal compressibility,
respiratory phasicity and response to augmentation.

Calf Veins: No evidence of thrombus. Normal compressibility and flow
on color Doppler imaging.

GSV at SFJ:  No reflux

GSV at Proximal Thigh:  No reflux

GSV at Mid Thigh:  No reflux

GSV at Distal Thigh:  No reflux

GSV at Prox Calf:  No reflux

GSV at Mid Calf:  No reflux

GSV at Distal Calf:  No reflux

SSV at Sapheno popliteal junction:  No reflux

SSV at mid calf:  No reflux

SSF at distal calf:  No reflux

Varicosities:  None

Other Findings: Perforator in the left posterior mid calf measures 2
mm with no measurable reflux.
IMPRESSION: Sonographic survey of the bilateral lower extremities negative for
DVT.

No measurable reflux of the bilateral great saphenous vein or small
saphenous vein.

Right small saphenous vein appears to be a thigh extender.

## 2020-06-21 ENCOUNTER — Other Ambulatory Visit: Payer: Self-pay

## 2020-06-21 ENCOUNTER — Ambulatory Visit (INDEPENDENT_AMBULATORY_CARE_PROVIDER_SITE_OTHER): Payer: Self-pay | Admitting: Plastic Surgery

## 2020-06-21 ENCOUNTER — Encounter: Payer: Self-pay | Admitting: Plastic Surgery

## 2020-06-21 VITALS — BP 146/89 | HR 75 | Temp 98.5°F | Ht 66.0 in | Wt 187.0 lb

## 2020-06-21 DIAGNOSIS — Z719 Counseling, unspecified: Secondary | ICD-10-CM

## 2020-06-21 NOTE — Progress Notes (Signed)
Preoperative Dx: Hyperpigmentation of the lower legs after ND YAG laser  Postoperative Dx:  same  Procedure: laser to legs  Anesthesia: none  Description of Procedure:  Risks and complications were explained to the patient. Consent was confirmed and signed. Time out was called and all information was confirmed to be correct. The area  area was prepped with alcohol and wiped dry. The IPL laser was set at 7.4 J/cm2. The legs were lasered. The patient tolerated the procedure well and there were no complications. The patient is to follow up in 4 weeks.

## 2020-07-25 ENCOUNTER — Other Ambulatory Visit: Payer: Self-pay | Admitting: Gastroenterology

## 2020-08-12 ENCOUNTER — Telehealth: Payer: Self-pay | Admitting: Gastroenterology

## 2020-08-12 MED ORDER — PANTOPRAZOLE SODIUM 40 MG PO TBEC: DELAYED_RELEASE_TABLET | ORAL | 3 refills | Status: DC

## 2020-08-12 NOTE — Telephone Encounter (Signed)
Inbound call from patient requesting a years refill for Protonix be sent to pharmacy please.

## 2020-08-12 NOTE — Telephone Encounter (Signed)
Sent!

## 2020-08-13 ENCOUNTER — Encounter: Payer: Self-pay | Admitting: Plastic Surgery

## 2020-08-13 ENCOUNTER — Other Ambulatory Visit: Payer: Self-pay

## 2020-08-13 ENCOUNTER — Ambulatory Visit (INDEPENDENT_AMBULATORY_CARE_PROVIDER_SITE_OTHER): Payer: Self-pay | Admitting: Plastic Surgery

## 2020-08-13 VITALS — BP 154/100 | HR 73

## 2020-08-13 DIAGNOSIS — Z719 Counseling, unspecified: Secondary | ICD-10-CM

## 2020-08-13 NOTE — Progress Notes (Signed)
The patient is a 70 year old female here for follow-up on her laser.  We did some ND YAG laser to her legs for venous issues.  There was some bruising that resulted.  We then did the IPL to see if we could get the hyperpigmentation to resolve.  It seems of gotten a little bit better but not resolved completely.  I offered to do it today.  She would like to wait a little bit longer and I think that is perfectly reasonable.  She will come back in a few months.

## 2020-09-15 DIAGNOSIS — R0981 Nasal congestion: Secondary | ICD-10-CM | POA: Diagnosis not present

## 2020-09-15 DIAGNOSIS — E1165 Type 2 diabetes mellitus with hyperglycemia: Secondary | ICD-10-CM | POA: Diagnosis not present

## 2020-09-15 DIAGNOSIS — H6591 Unspecified nonsuppurative otitis media, right ear: Secondary | ICD-10-CM | POA: Diagnosis not present

## 2020-09-15 DIAGNOSIS — Z20828 Contact with and (suspected) exposure to other viral communicable diseases: Secondary | ICD-10-CM | POA: Diagnosis not present

## 2020-09-15 DIAGNOSIS — Z6830 Body mass index (BMI) 30.0-30.9, adult: Secondary | ICD-10-CM | POA: Diagnosis not present

## 2020-09-15 DIAGNOSIS — J302 Other seasonal allergic rhinitis: Secondary | ICD-10-CM | POA: Diagnosis not present

## 2020-09-19 DIAGNOSIS — K8020 Calculus of gallbladder without cholecystitis without obstruction: Secondary | ICD-10-CM | POA: Diagnosis not present

## 2020-09-19 DIAGNOSIS — R197 Diarrhea, unspecified: Secondary | ICD-10-CM | POA: Diagnosis not present

## 2020-09-19 DIAGNOSIS — R1013 Epigastric pain: Secondary | ICD-10-CM | POA: Diagnosis not present

## 2020-09-19 DIAGNOSIS — R748 Abnormal levels of other serum enzymes: Secondary | ICD-10-CM | POA: Diagnosis not present

## 2020-09-19 DIAGNOSIS — K7689 Other specified diseases of liver: Secondary | ICD-10-CM | POA: Diagnosis not present

## 2020-09-20 DIAGNOSIS — R1013 Epigastric pain: Secondary | ICD-10-CM | POA: Diagnosis not present

## 2020-09-20 DIAGNOSIS — K7689 Other specified diseases of liver: Secondary | ICD-10-CM | POA: Diagnosis not present

## 2020-09-21 DIAGNOSIS — E1165 Type 2 diabetes mellitus with hyperglycemia: Secondary | ICD-10-CM | POA: Diagnosis not present

## 2020-09-21 DIAGNOSIS — Z20822 Contact with and (suspected) exposure to covid-19: Secondary | ICD-10-CM | POA: Diagnosis not present

## 2020-09-21 DIAGNOSIS — E7800 Pure hypercholesterolemia, unspecified: Secondary | ICD-10-CM | POA: Diagnosis not present

## 2020-09-21 DIAGNOSIS — E119 Type 2 diabetes mellitus without complications: Secondary | ICD-10-CM | POA: Diagnosis not present

## 2020-09-21 DIAGNOSIS — R748 Abnormal levels of other serum enzymes: Secondary | ICD-10-CM | POA: Diagnosis not present

## 2020-09-21 DIAGNOSIS — I10 Essential (primary) hypertension: Secondary | ICD-10-CM | POA: Diagnosis not present

## 2020-09-21 DIAGNOSIS — Z01818 Encounter for other preprocedural examination: Secondary | ICD-10-CM | POA: Diagnosis not present

## 2020-09-21 DIAGNOSIS — Z01812 Encounter for preprocedural laboratory examination: Secondary | ICD-10-CM | POA: Diagnosis not present

## 2020-09-21 DIAGNOSIS — R1013 Epigastric pain: Secondary | ICD-10-CM | POA: Diagnosis not present

## 2020-09-21 DIAGNOSIS — R17 Unspecified jaundice: Secondary | ICD-10-CM | POA: Diagnosis not present

## 2020-09-21 DIAGNOSIS — K8020 Calculus of gallbladder without cholecystitis without obstruction: Secondary | ICD-10-CM | POA: Diagnosis not present

## 2020-09-22 ENCOUNTER — Telehealth: Payer: Self-pay

## 2020-09-22 NOTE — Telephone Encounter (Signed)
We need the blood work back for me to review first before I can sent the rx. -Dr. Chauncey Cruel

## 2020-09-22 NOTE — Telephone Encounter (Signed)
Pt called and stated that she has not got her LFTs and would like to know if she can still get the Rx lamisil? Please advice

## 2020-09-23 NOTE — Telephone Encounter (Signed)
Pt was informed lab orders were ready for her to pick up, pt states she will come by the office on Tuesday

## 2020-09-27 DIAGNOSIS — R7989 Other specified abnormal findings of blood chemistry: Secondary | ICD-10-CM | POA: Diagnosis not present

## 2020-09-27 DIAGNOSIS — E119 Type 2 diabetes mellitus without complications: Secondary | ICD-10-CM | POA: Diagnosis not present

## 2020-09-27 DIAGNOSIS — K8010 Calculus of gallbladder with chronic cholecystitis without obstruction: Secondary | ICD-10-CM | POA: Diagnosis not present

## 2020-09-27 DIAGNOSIS — K811 Chronic cholecystitis: Secondary | ICD-10-CM | POA: Diagnosis not present

## 2020-09-27 DIAGNOSIS — K8020 Calculus of gallbladder without cholecystitis without obstruction: Secondary | ICD-10-CM | POA: Diagnosis not present

## 2020-10-01 ENCOUNTER — Other Ambulatory Visit: Payer: PPO | Admitting: Plastic Surgery

## 2020-10-10 HISTORY — PX: CHOLECYSTECTOMY: SHX55

## 2020-10-22 DIAGNOSIS — M79675 Pain in left toe(s): Secondary | ICD-10-CM | POA: Diagnosis not present

## 2020-10-22 DIAGNOSIS — B351 Tinea unguium: Secondary | ICD-10-CM | POA: Diagnosis not present

## 2020-10-22 DIAGNOSIS — M79674 Pain in right toe(s): Secondary | ICD-10-CM | POA: Diagnosis not present

## 2020-10-28 ENCOUNTER — Telehealth: Payer: Self-pay | Admitting: Urology

## 2020-10-28 NOTE — Telephone Encounter (Signed)
Pt had her lab work done at [facility] and [facility] sent her a letter that lab work is normal.. pt would like to know what next step is. Please Advise.

## 2020-11-02 ENCOUNTER — Telehealth (INDEPENDENT_AMBULATORY_CARE_PROVIDER_SITE_OTHER): Payer: PPO | Admitting: Sports Medicine

## 2020-11-02 DIAGNOSIS — B351 Tinea unguium: Secondary | ICD-10-CM

## 2020-11-02 MED ORDER — TERBINAFINE HCL 250 MG PO TABS: 250.0000 mg | ORAL_TABLET | Freq: Every day | ORAL | 0 refills | Status: DC

## 2020-11-02 NOTE — Progress Notes (Signed)
Called patient to conduct a virtual visit.  Patient did not answer left a voicemail on her cell phone letting her know that her lab work was normal.  I have sent Lamisil for her to take 1 pill daily for nail fungus.  If there is any problems or issues advised patient to call office and to make sure when she starts this medication to get a follow-up in 6 weeks to be rechecked in person.

## 2020-11-12 ENCOUNTER — Other Ambulatory Visit: Payer: Self-pay | Admitting: Plastic Surgery

## 2020-11-30 ENCOUNTER — Telehealth: Payer: Self-pay | Admitting: Sports Medicine

## 2020-11-30 NOTE — Telephone Encounter (Signed)
Pt was given rx for pills for fungus.  Pt states she is having issues with her diabetes and does not want to take any other pills right now.  Would like to know if you can give something topical instead.  [place]

## 2020-11-30 NOTE — Telephone Encounter (Signed)
Lm for pt to advise of products/pricing

## 2020-12-10 DIAGNOSIS — J40 Bronchitis, not specified as acute or chronic: Secondary | ICD-10-CM | POA: Diagnosis not present

## 2020-12-10 DIAGNOSIS — Z79899 Other long term (current) drug therapy: Secondary | ICD-10-CM | POA: Diagnosis not present

## 2020-12-10 DIAGNOSIS — J329 Chronic sinusitis, unspecified: Secondary | ICD-10-CM | POA: Diagnosis not present

## 2020-12-10 DIAGNOSIS — J309 Allergic rhinitis, unspecified: Secondary | ICD-10-CM | POA: Diagnosis not present

## 2020-12-10 DIAGNOSIS — K219 Gastro-esophageal reflux disease without esophagitis: Secondary | ICD-10-CM | POA: Diagnosis not present

## 2020-12-10 DIAGNOSIS — Z6830 Body mass index (BMI) 30.0-30.9, adult: Secondary | ICD-10-CM | POA: Diagnosis not present

## 2020-12-10 DIAGNOSIS — Z0000 Encounter for general adult medical examination without abnormal findings: Secondary | ICD-10-CM | POA: Diagnosis not present

## 2020-12-10 DIAGNOSIS — Z1331 Encounter for screening for depression: Secondary | ICD-10-CM | POA: Diagnosis not present

## 2020-12-10 DIAGNOSIS — E7800 Pure hypercholesterolemia, unspecified: Secondary | ICD-10-CM | POA: Diagnosis not present

## 2020-12-10 DIAGNOSIS — R809 Proteinuria, unspecified: Secondary | ICD-10-CM | POA: Diagnosis not present

## 2020-12-10 DIAGNOSIS — E1129 Type 2 diabetes mellitus with other diabetic kidney complication: Secondary | ICD-10-CM | POA: Diagnosis not present

## 2020-12-10 DIAGNOSIS — E1165 Type 2 diabetes mellitus with hyperglycemia: Secondary | ICD-10-CM | POA: Diagnosis not present

## 2021-01-21 DIAGNOSIS — Z1231 Encounter for screening mammogram for malignant neoplasm of breast: Secondary | ICD-10-CM | POA: Diagnosis not present

## 2021-02-08 DIAGNOSIS — Z6831 Body mass index (BMI) 31.0-31.9, adult: Secondary | ICD-10-CM | POA: Diagnosis not present

## 2021-02-08 DIAGNOSIS — J329 Chronic sinusitis, unspecified: Secondary | ICD-10-CM | POA: Diagnosis not present

## 2021-02-08 DIAGNOSIS — Z20828 Contact with and (suspected) exposure to other viral communicable diseases: Secondary | ICD-10-CM | POA: Diagnosis not present

## 2021-02-08 DIAGNOSIS — J40 Bronchitis, not specified as acute or chronic: Secondary | ICD-10-CM | POA: Diagnosis not present

## 2021-02-18 DIAGNOSIS — H6592 Unspecified nonsuppurative otitis media, left ear: Secondary | ICD-10-CM | POA: Diagnosis not present

## 2021-02-18 DIAGNOSIS — J329 Chronic sinusitis, unspecified: Secondary | ICD-10-CM | POA: Diagnosis not present

## 2021-02-18 DIAGNOSIS — Z6830 Body mass index (BMI) 30.0-30.9, adult: Secondary | ICD-10-CM | POA: Diagnosis not present

## 2021-02-18 DIAGNOSIS — J40 Bronchitis, not specified as acute or chronic: Secondary | ICD-10-CM | POA: Diagnosis not present

## 2021-03-10 DIAGNOSIS — S52532A Colles' fracture of left radius, initial encounter for closed fracture: Secondary | ICD-10-CM | POA: Diagnosis not present

## 2021-03-17 DIAGNOSIS — S52532A Colles' fracture of left radius, initial encounter for closed fracture: Secondary | ICD-10-CM | POA: Diagnosis not present

## 2021-03-24 DIAGNOSIS — S52532A Colles' fracture of left radius, initial encounter for closed fracture: Secondary | ICD-10-CM | POA: Diagnosis not present

## 2021-04-22 DIAGNOSIS — S52532A Colles' fracture of left radius, initial encounter for closed fracture: Secondary | ICD-10-CM | POA: Diagnosis not present

## 2021-05-02 DIAGNOSIS — M25632 Stiffness of left wrist, not elsewhere classified: Secondary | ICD-10-CM | POA: Diagnosis not present

## 2021-05-02 DIAGNOSIS — S52532D Colles' fracture of left radius, subsequent encounter for closed fracture with routine healing: Secondary | ICD-10-CM | POA: Diagnosis not present

## 2021-05-09 DIAGNOSIS — S52532D Colles' fracture of left radius, subsequent encounter for closed fracture with routine healing: Secondary | ICD-10-CM | POA: Diagnosis not present

## 2021-05-09 DIAGNOSIS — M25632 Stiffness of left wrist, not elsewhere classified: Secondary | ICD-10-CM | POA: Diagnosis not present

## 2021-05-12 DIAGNOSIS — M25632 Stiffness of left wrist, not elsewhere classified: Secondary | ICD-10-CM | POA: Diagnosis not present

## 2021-05-12 DIAGNOSIS — S52532D Colles' fracture of left radius, subsequent encounter for closed fracture with routine healing: Secondary | ICD-10-CM | POA: Diagnosis not present

## 2021-05-16 DIAGNOSIS — S52532D Colles' fracture of left radius, subsequent encounter for closed fracture with routine healing: Secondary | ICD-10-CM | POA: Diagnosis not present

## 2021-05-16 DIAGNOSIS — M25632 Stiffness of left wrist, not elsewhere classified: Secondary | ICD-10-CM | POA: Diagnosis not present

## 2021-05-18 DIAGNOSIS — S52532D Colles' fracture of left radius, subsequent encounter for closed fracture with routine healing: Secondary | ICD-10-CM | POA: Diagnosis not present

## 2021-05-18 DIAGNOSIS — M25632 Stiffness of left wrist, not elsewhere classified: Secondary | ICD-10-CM | POA: Diagnosis not present

## 2021-05-23 DIAGNOSIS — M25632 Stiffness of left wrist, not elsewhere classified: Secondary | ICD-10-CM | POA: Diagnosis not present

## 2021-05-23 DIAGNOSIS — S52532D Colles' fracture of left radius, subsequent encounter for closed fracture with routine healing: Secondary | ICD-10-CM | POA: Diagnosis not present

## 2021-08-04 DIAGNOSIS — N959 Unspecified menopausal and perimenopausal disorder: Secondary | ICD-10-CM | POA: Diagnosis not present

## 2021-08-29 ENCOUNTER — Other Ambulatory Visit: Payer: Self-pay

## 2021-08-29 MED ORDER — PANTOPRAZOLE SODIUM 40 MG PO TBEC: DELAYED_RELEASE_TABLET | ORAL | 0 refills | Status: DC

## 2021-09-02 ENCOUNTER — Telehealth: Payer: Self-pay | Admitting: Gastroenterology

## 2021-09-02 NOTE — Telephone Encounter (Signed)
Inbound call from patient stating that she is in need of a refill for Protonix. Patient is scheduled for OV with Dr. Lyndel Safe on 5/12. Patient is seeking advice if she can go ahead and get medication before appointment. Please advise.  ?

## 2021-10-19 ENCOUNTER — Ambulatory Visit: Payer: PPO | Admitting: Gastroenterology

## 2021-10-21 ENCOUNTER — Encounter: Payer: Self-pay | Admitting: Gastroenterology

## 2021-10-21 ENCOUNTER — Ambulatory Visit (INDEPENDENT_AMBULATORY_CARE_PROVIDER_SITE_OTHER): Payer: PPO | Admitting: Gastroenterology

## 2021-10-21 VITALS — BP 158/90 | HR 75 | Ht 66.0 in | Wt 187.2 lb

## 2021-10-21 DIAGNOSIS — K219 Gastro-esophageal reflux disease without esophagitis: Secondary | ICD-10-CM

## 2021-10-21 MED ORDER — PANTOPRAZOLE SODIUM 40 MG PO TBEC: DELAYED_RELEASE_TABLET | ORAL | 4 refills | Status: DC

## 2021-10-21 NOTE — Progress Notes (Signed)
? ? ?Chief Complaint: FU ? ? ? ?ASSESSMENT AND PLAN;  ? ?#1.  GERD ? ?#2. H/O Right sided colitis likely Ac GEitis 05/2020 (resolved with Cipro/Flagyl) ? ?#3. H/O Diverticular abscess (08/2017, Resolved) - First episode of acute diverticulitis, complicated by 2.7 cm abscess, managed conservatively. ? ?  #4. H/O colonic polyps-tubular adenomas. Next colon 12/2022 ? ? ?Plan: ?-Continue Protonix '40mg'$  po QD #90, 4RF. Can try it QOD. ?-Continue current treatment. ?-She will make appt with Dr Jeryl Columbia for annual physical. ?-FU as needed ?-D/W husband ? ? ? ? ? ?HPI:   ? ?Mary Colon is a 71 y.o. female  ?For follow-up visit ? ?Here for medication refill-Protonix ? ?No GI complaints ? ?S/P lap chole Dr Dema Severin 09/2020 for symptomatic cholelithiasis. ? ?No nausea, vomiting, heartburn, regurgitation, odynophagia or dysphagia.  No significant diarrhea or constipation.  No melena or hematochezia. No unintentional weight loss. No abdominal pain. ? ? ?Past GI procedures: ?-Colonoscopy 12/2017- (PCF) mod sigmoid diverticulosis. Rpt colon 5 ys d/t previous advanced polyps. ?-EGD 09/16/2014-mild gastritis with negative CLOtest, negative small bowel biopsies. ? ? ?Past Medical History:  ?Diagnosis Date  ? Abnormal colonoscopy 02/22/2015  ? Diabetes mellitus without complication ([place])   ? Diabetic eye exam ([place]) 10/10/2015  ? no retinopathy  ? H/O mammogram 12/01/2015  ? normal  ? History of pneumonia   ? History of UTI   ? Hypertension   ? Microalbuminuria absent 03/20/2017  ? ? ?Past Surgical History:  ?Procedure Laterality Date  ? CHOLECYSTECTOMY  10/2020  ? COLONOSCOPY  02/22/2015  ? Colonic polyp status post polypectomy. Moderate predominantly sigmoid diverticulosis. Tubular adenoma.   ? ESOPHAGOGASTRODUODENOSCOPY  09/16/2014  ? Mild gastrtis.   ? ? ?Family History  ?Problem Relation Age of Onset  ? Cancer Sister   ?     myoxid lipo sarcoma  ? Heart disease Mother   ? Diabetes Mother   ? Heart disease Father   ? ? ?Social History   ? ?Tobacco Use  ? Smoking status: Never  ? Smokeless tobacco: Never  ?Vaping Use  ? Vaping Use: Never used  ?Substance Use Topics  ? Alcohol use: Never  ? Drug use: Never  ? ? ?Current Outpatient Medications  ?Medication Sig Dispense Refill  ? FREESTYLE LITE test strip USE TO TEST BLOOD SUGAR BID  3  ? loratadine (CLARITIN) 10 MG tablet loratadine 10 mg tablet ? TK 1 T PO D    ? Nutritional Supplements (JUICE PLUS FIBRE PO) Take 1 capsule by mouth daily.    ? pantoprazole (PROTONIX) 40 MG tablet TAKE 1 TABLET(40 MG) BY MOUTH DAILY 90 tablet 0  ? sennosides-docusate sodium (SENOKOT-S) 8.6-50 MG tablet Take 1 tablet by mouth daily.    ? ?Current Facility-Administered Medications  ?Medication Dose Route Frequency Provider Last Rate Last Admin  ? 0.9 %  sodium chloride infusion  500 mL Intravenous Once Jackquline Denmark, MD      ? ? ?Allergies  ?Allergen Reactions  ? Codeine Nausea And Vomiting  ? Hydrocodone Nausea And Vomiting  ? Oxycodone Nausea And Vomiting  ? Azithromycin Rash  ? Methylprednisolone Rash  ? ? ?Review of Systems:  ?neg ? ?  ? ?Physical Exam:   ? ?BP (!) 158/90   Pulse 75   Ht '5\' 6"'$  (1.676 m)   Wt 187 lb 4 oz (84.9 kg)   LMP  (LMP Unknown)   SpO2 96%   BMI 30.22 kg/m?  ?Filed Weights  ? 10/21/21 0827  ?  Weight: 187 lb 4 oz (84.9 kg)  ? ?Constitutional:  Well-developed, in no acute distress. ?Psychiatric: Normal mood and affect. Behavior is normal. ?HEENT: Conjunctivae are normal. No scleral icterus. ?Cardiovascular: Normal rate, regular rhythm. No edema ?Pulmonary/chest: Effort normal and breath sounds normal. No wheezing, rales or rhonchi. ?Abdominal: Soft, nondistended. Nontender. Bowel sounds active throughout. There are no masses palpable. No hepatomegaly. ?Rectal:  defered ?Neurological: Alert and oriented to person place and time. ?Skin: Skin is warm and dry. No rashes noted. ? ?Seen in presence of patient's husband. ? ? ?Carmell Austria, MD 10/21/2021, 8:40 AM ? ? ? ?

## 2021-10-21 NOTE — Patient Instructions (Addendum)
If you are age 71 or older, your body mass index should be between 23-30. Your Body mass index is 30.22 kg/m?Marland Kitchen If this is out of the aforementioned range listed, please consider follow up with your Primary Care Provider. ? ?If you are age 19 or younger, your body mass index should be between 19-25. Your Body mass index is 30.22 kg/m?Marland Kitchen If this is out of the aformentioned range listed, please consider follow up with your Primary Care Provider.  ? ?________________________________________________________ ? ?The [facility] providers would like to encourage you to use [facility] to communicate with providers for non-urgent requests or questions.  Due to long hold times on the telephone, sending your provider a message by [facility] may be a faster and more efficient way to get a response.  Please allow 48 business hours for a response.  Please remember that this is for non-urgent requests.  ?_______________________________________________________ ? ?We have sent the following medications to your pharmacy for you to pick up at your convenience: ?Protonix ? ?Continue current medications. ? ?Follow up as needed. ? ?Thank you, ? ?Dr. Jackquline Denmark ? ? ? ? ? ?We want to thank you for trusting [facility] with your care. All of our staff and providers value the relationships we have built with our patients, and it is an honor to care for you.  ? ?We are writing to let you know that [facility] will close on Oct 24, 2021, and we invite you to continue to see Dr. Carmell Austria and Gerrit Heck at the [facility] [place] office location. We are consolidating our serices at these [facility] practices to better provide care. Our office staff will work with you to ensure a seamless transition.  ? ?Gerrit Heck, DO -Dr. Bryan Lemma will be movig to [facility] at [address], [place], [place] [address], effective Oct 24, 2021.  Contact [phone] to schedule an  appointment with him.  ? ?Carmell Austria, MD- Dr. Lyndel Safe will be movig to [facility] at [address] [place],  [address], effective Oct 24, 2021.  Contact [phone] to schedule an appointment with him.  ? ?Requesting Medical Records ?If you need to request your medical records, please follow the instructions below. Your medical records are confidential, and a copy can be transferred to another provider or released to you or another person you designate only with your permission. ? ?There are several ways to request your medical records: ?Requests for medical records can be submitted through our practice.   ?You can also request your records electronically, in your MyChart account by selecting the ?Request Health Records? tab.  ?If you need additional information on how to request records, please go to [email or web address]/, choose Patient Information, then select Request Medical Records. ?To make an appointment or if you have any questions about your health care needs, please contact our office at [phone] and one of our staff members will be glad to assist you. ?[facility] is committed to providing exceptional care for you and our community. Thank you for allowing [place] to serve your health care needs. ?Sincerely, ? ?Windy Canny, Director [facility] ?[facility] also offers convenient virtual care options. Sore throat? Sinus problems? Cold or flu symptoms? Get care from the comfort of home with [facility] Video Visits and e-Visits. Learn more about the non-emergency conditions treated and start your virtual visit at [email or web address] ? ?

## 2021-12-09 DIAGNOSIS — R059 Cough, unspecified: Secondary | ICD-10-CM | POA: Diagnosis not present

## 2021-12-09 DIAGNOSIS — J019 Acute sinusitis, unspecified: Secondary | ICD-10-CM | POA: Diagnosis not present

## 2021-12-09 DIAGNOSIS — R0981 Nasal congestion: Secondary | ICD-10-CM | POA: Diagnosis not present

## 2021-12-09 DIAGNOSIS — B9689 Other specified bacterial agents as the cause of diseases classified elsewhere: Secondary | ICD-10-CM | POA: Diagnosis not present

## 2021-12-14 DIAGNOSIS — Z6830 Body mass index (BMI) 30.0-30.9, adult: Secondary | ICD-10-CM | POA: Diagnosis not present

## 2021-12-14 DIAGNOSIS — R809 Proteinuria, unspecified: Secondary | ICD-10-CM | POA: Diagnosis not present

## 2021-12-14 DIAGNOSIS — I10 Essential (primary) hypertension: Secondary | ICD-10-CM | POA: Diagnosis not present

## 2021-12-14 DIAGNOSIS — Z79899 Other long term (current) drug therapy: Secondary | ICD-10-CM | POA: Diagnosis not present

## 2021-12-14 DIAGNOSIS — E1129 Type 2 diabetes mellitus with other diabetic kidney complication: Secondary | ICD-10-CM | POA: Diagnosis not present

## 2021-12-14 DIAGNOSIS — Z0000 Encounter for general adult medical examination without abnormal findings: Secondary | ICD-10-CM | POA: Diagnosis not present

## 2021-12-14 DIAGNOSIS — Z13820 Encounter for screening for osteoporosis: Secondary | ICD-10-CM | POA: Diagnosis not present

## 2021-12-14 DIAGNOSIS — E7800 Pure hypercholesterolemia, unspecified: Secondary | ICD-10-CM | POA: Diagnosis not present

## 2021-12-14 DIAGNOSIS — Z1331 Encounter for screening for depression: Secondary | ICD-10-CM | POA: Diagnosis not present

## 2021-12-14 DIAGNOSIS — I700 Atherosclerosis of aorta: Secondary | ICD-10-CM | POA: Diagnosis not present

## 2021-12-14 DIAGNOSIS — Z780 Asymptomatic menopausal state: Secondary | ICD-10-CM | POA: Diagnosis not present

## 2021-12-14 DIAGNOSIS — E1165 Type 2 diabetes mellitus with hyperglycemia: Secondary | ICD-10-CM | POA: Diagnosis not present

## 2022-01-27 DIAGNOSIS — Z1231 Encounter for screening mammogram for malignant neoplasm of breast: Secondary | ICD-10-CM | POA: Diagnosis not present

## 2022-02-09 DIAGNOSIS — I10 Essential (primary) hypertension: Secondary | ICD-10-CM | POA: Diagnosis not present

## 2022-02-09 DIAGNOSIS — E785 Hyperlipidemia, unspecified: Secondary | ICD-10-CM | POA: Diagnosis not present

## 2022-02-20 DIAGNOSIS — I700 Atherosclerosis of aorta: Secondary | ICD-10-CM | POA: Diagnosis not present

## 2022-02-20 DIAGNOSIS — I10 Essential (primary) hypertension: Secondary | ICD-10-CM | POA: Diagnosis not present

## 2022-02-22 DIAGNOSIS — E663 Overweight: Secondary | ICD-10-CM | POA: Diagnosis not present

## 2022-02-22 DIAGNOSIS — I700 Atherosclerosis of aorta: Secondary | ICD-10-CM | POA: Diagnosis not present

## 2022-02-22 DIAGNOSIS — I10 Essential (primary) hypertension: Secondary | ICD-10-CM | POA: Diagnosis not present

## 2022-02-22 DIAGNOSIS — E1169 Type 2 diabetes mellitus with other specified complication: Secondary | ICD-10-CM | POA: Diagnosis not present

## 2022-02-22 DIAGNOSIS — J309 Allergic rhinitis, unspecified: Secondary | ICD-10-CM | POA: Diagnosis not present

## 2022-02-22 DIAGNOSIS — K5790 Diverticulosis of intestine, part unspecified, without perforation or abscess without bleeding: Secondary | ICD-10-CM | POA: Diagnosis not present

## 2022-02-22 DIAGNOSIS — E1165 Type 2 diabetes mellitus with hyperglycemia: Secondary | ICD-10-CM | POA: Diagnosis not present

## 2022-02-22 DIAGNOSIS — K5900 Constipation, unspecified: Secondary | ICD-10-CM | POA: Diagnosis not present

## 2022-02-22 DIAGNOSIS — E785 Hyperlipidemia, unspecified: Secondary | ICD-10-CM | POA: Diagnosis not present

## 2022-02-22 DIAGNOSIS — K219 Gastro-esophageal reflux disease without esophagitis: Secondary | ICD-10-CM | POA: Diagnosis not present

## 2022-03-03 DIAGNOSIS — R809 Proteinuria, unspecified: Secondary | ICD-10-CM | POA: Diagnosis not present

## 2022-03-03 DIAGNOSIS — E1129 Type 2 diabetes mellitus with other diabetic kidney complication: Secondary | ICD-10-CM | POA: Diagnosis not present

## 2022-03-07 DIAGNOSIS — I700 Atherosclerosis of aorta: Secondary | ICD-10-CM | POA: Diagnosis not present

## 2022-03-07 DIAGNOSIS — K760 Fatty (change of) liver, not elsewhere classified: Secondary | ICD-10-CM | POA: Diagnosis not present

## 2022-03-07 DIAGNOSIS — R1032 Left lower quadrant pain: Secondary | ICD-10-CM | POA: Diagnosis not present

## 2022-03-07 DIAGNOSIS — K5790 Diverticulosis of intestine, part unspecified, without perforation or abscess without bleeding: Secondary | ICD-10-CM | POA: Diagnosis not present

## 2022-03-07 DIAGNOSIS — K5900 Constipation, unspecified: Secondary | ICD-10-CM | POA: Diagnosis not present

## 2022-03-08 DIAGNOSIS — R1032 Left lower quadrant pain: Secondary | ICD-10-CM | POA: Diagnosis not present

## 2022-03-08 DIAGNOSIS — K760 Fatty (change of) liver, not elsewhere classified: Secondary | ICD-10-CM | POA: Diagnosis not present

## 2022-03-09 DIAGNOSIS — E1129 Type 2 diabetes mellitus with other diabetic kidney complication: Secondary | ICD-10-CM | POA: Diagnosis not present

## 2022-03-11 DIAGNOSIS — E785 Hyperlipidemia, unspecified: Secondary | ICD-10-CM | POA: Diagnosis not present

## 2022-03-11 DIAGNOSIS — I10 Essential (primary) hypertension: Secondary | ICD-10-CM | POA: Diagnosis not present

## 2022-03-24 DIAGNOSIS — Z6829 Body mass index (BMI) 29.0-29.9, adult: Secondary | ICD-10-CM | POA: Diagnosis not present

## 2022-03-24 DIAGNOSIS — I700 Atherosclerosis of aorta: Secondary | ICD-10-CM | POA: Diagnosis not present

## 2022-03-24 DIAGNOSIS — R809 Proteinuria, unspecified: Secondary | ICD-10-CM | POA: Diagnosis not present

## 2022-03-24 DIAGNOSIS — B372 Candidiasis of skin and nail: Secondary | ICD-10-CM | POA: Diagnosis not present

## 2022-03-24 DIAGNOSIS — J309 Allergic rhinitis, unspecified: Secondary | ICD-10-CM | POA: Diagnosis not present

## 2022-03-24 DIAGNOSIS — E1129 Type 2 diabetes mellitus with other diabetic kidney complication: Secondary | ICD-10-CM | POA: Diagnosis not present

## 2022-04-11 DIAGNOSIS — I10 Essential (primary) hypertension: Secondary | ICD-10-CM | POA: Diagnosis not present

## 2022-04-11 DIAGNOSIS — E782 Mixed hyperlipidemia: Secondary | ICD-10-CM | POA: Diagnosis not present

## 2022-07-05 DIAGNOSIS — E1165 Type 2 diabetes mellitus with hyperglycemia: Secondary | ICD-10-CM | POA: Diagnosis not present

## 2022-07-05 DIAGNOSIS — Z6830 Body mass index (BMI) 30.0-30.9, adult: Secondary | ICD-10-CM | POA: Diagnosis not present

## 2022-07-05 DIAGNOSIS — E1129 Type 2 diabetes mellitus with other diabetic kidney complication: Secondary | ICD-10-CM | POA: Diagnosis not present

## 2022-07-05 DIAGNOSIS — I700 Atherosclerosis of aorta: Secondary | ICD-10-CM | POA: Diagnosis not present

## 2022-07-05 DIAGNOSIS — R809 Proteinuria, unspecified: Secondary | ICD-10-CM | POA: Diagnosis not present

## 2022-08-10 DIAGNOSIS — E1165 Type 2 diabetes mellitus with hyperglycemia: Secondary | ICD-10-CM | POA: Diagnosis not present

## 2022-08-10 DIAGNOSIS — K219 Gastro-esophageal reflux disease without esophagitis: Secondary | ICD-10-CM | POA: Diagnosis not present

## 2022-10-12 DIAGNOSIS — E119 Type 2 diabetes mellitus without complications: Secondary | ICD-10-CM | POA: Diagnosis not present

## 2022-10-12 DIAGNOSIS — B070 Plantar wart: Secondary | ICD-10-CM | POA: Diagnosis not present

## 2022-11-22 ENCOUNTER — Other Ambulatory Visit: Payer: Self-pay

## 2022-11-22 MED ORDER — PANTOPRAZOLE SODIUM 40 MG PO TBEC: DELAYED_RELEASE_TABLET | ORAL | 2 refills | Status: DC

## 2022-12-06 DIAGNOSIS — Z6830 Body mass index (BMI) 30.0-30.9, adult: Secondary | ICD-10-CM | POA: Diagnosis not present

## 2022-12-06 DIAGNOSIS — M5432 Sciatica, left side: Secondary | ICD-10-CM | POA: Diagnosis not present

## 2022-12-22 DIAGNOSIS — I700 Atherosclerosis of aorta: Secondary | ICD-10-CM | POA: Diagnosis not present

## 2022-12-22 DIAGNOSIS — E1169 Type 2 diabetes mellitus with other specified complication: Secondary | ICD-10-CM | POA: Diagnosis not present

## 2022-12-22 DIAGNOSIS — Z1331 Encounter for screening for depression: Secondary | ICD-10-CM | POA: Diagnosis not present

## 2022-12-22 DIAGNOSIS — Z6830 Body mass index (BMI) 30.0-30.9, adult: Secondary | ICD-10-CM | POA: Diagnosis not present

## 2022-12-22 DIAGNOSIS — E1165 Type 2 diabetes mellitus with hyperglycemia: Secondary | ICD-10-CM | POA: Diagnosis not present

## 2022-12-22 DIAGNOSIS — E785 Hyperlipidemia, unspecified: Secondary | ICD-10-CM | POA: Diagnosis not present

## 2022-12-22 DIAGNOSIS — Z1211 Encounter for screening for malignant neoplasm of colon: Secondary | ICD-10-CM | POA: Diagnosis not present

## 2022-12-22 DIAGNOSIS — E1129 Type 2 diabetes mellitus with other diabetic kidney complication: Secondary | ICD-10-CM | POA: Diagnosis not present

## 2022-12-22 DIAGNOSIS — Z0000 Encounter for general adult medical examination without abnormal findings: Secondary | ICD-10-CM | POA: Diagnosis not present

## 2022-12-22 DIAGNOSIS — Z79899 Other long term (current) drug therapy: Secondary | ICD-10-CM | POA: Diagnosis not present

## 2022-12-22 DIAGNOSIS — R809 Proteinuria, unspecified: Secondary | ICD-10-CM | POA: Diagnosis not present

## 2022-12-27 ENCOUNTER — Encounter: Payer: Self-pay | Admitting: Gastroenterology

## 2023-01-03 DIAGNOSIS — M5432 Sciatica, left side: Secondary | ICD-10-CM | POA: Diagnosis not present

## 2023-01-11 DIAGNOSIS — M5432 Sciatica, left side: Secondary | ICD-10-CM | POA: Diagnosis not present

## 2023-01-17 DIAGNOSIS — M5432 Sciatica, left side: Secondary | ICD-10-CM | POA: Diagnosis not present

## 2023-01-19 ENCOUNTER — Ambulatory Visit (AMBULATORY_SURGERY_CENTER): Payer: PPO

## 2023-01-19 VITALS — Ht 66.0 in | Wt 184.0 lb

## 2023-01-19 DIAGNOSIS — Z86010 Personal history of colonic polyps: Secondary | ICD-10-CM

## 2023-01-19 MED ORDER — PEG 3350-KCL-NA BICARB-NACL 420 G PO SOLR
4000.0000 mL | Freq: Once | ORAL | 0 refills | Status: AC
Start: 2023-01-19 — End: 2023-01-19

## 2023-01-19 NOTE — Progress Notes (Signed)
No egg or soy allergy known to patient  No issues known to pt with past sedation with any surgeries or procedures Patient denies ever being told they had issues or difficulty with intubation  No FH of Malignant Hyperthermia Pt is not on diet pills Pt is not on  home 02  Pt is not on blood thinners  Pt denies issues with constipation and uses Fleet enema No A fib or A flutter Have any cardiac testing pending--no Pt can ambulate independently Pt denies use of chewing tobacco Discussed diabetic I weight loss medication holds Discussed NSAID holds Checked BMI Pt instructed to use Singlecare.com or GoodRx for a price reduction on prep  Patient's chart reviewed by Cathlyn Parsons CNRA prior to previsit and patient appropriate for the [facility].  Pre visit completed and red dot placed by patient's name on their procedure day (on provider's schedule).

## 2023-01-22 DIAGNOSIS — M5432 Sciatica, left side: Secondary | ICD-10-CM | POA: Diagnosis not present

## 2023-01-29 DIAGNOSIS — M5432 Sciatica, left side: Secondary | ICD-10-CM | POA: Diagnosis not present

## 2023-01-31 DIAGNOSIS — E663 Overweight: Secondary | ICD-10-CM | POA: Diagnosis not present

## 2023-01-31 DIAGNOSIS — E1165 Type 2 diabetes mellitus with hyperglycemia: Secondary | ICD-10-CM | POA: Diagnosis not present

## 2023-01-31 DIAGNOSIS — J309 Allergic rhinitis, unspecified: Secondary | ICD-10-CM | POA: Diagnosis not present

## 2023-01-31 DIAGNOSIS — M5432 Sciatica, left side: Secondary | ICD-10-CM | POA: Diagnosis not present

## 2023-01-31 DIAGNOSIS — K219 Gastro-esophageal reflux disease without esophagitis: Secondary | ICD-10-CM | POA: Diagnosis not present

## 2023-01-31 DIAGNOSIS — G8929 Other chronic pain: Secondary | ICD-10-CM | POA: Diagnosis not present

## 2023-01-31 DIAGNOSIS — D8481 Immunodeficiency due to conditions classified elsewhere: Secondary | ICD-10-CM | POA: Diagnosis not present

## 2023-01-31 DIAGNOSIS — I10 Essential (primary) hypertension: Secondary | ICD-10-CM | POA: Diagnosis not present

## 2023-02-01 DIAGNOSIS — Z1231 Encounter for screening mammogram for malignant neoplasm of breast: Secondary | ICD-10-CM | POA: Diagnosis not present

## 2023-02-02 ENCOUNTER — Encounter: Payer: Self-pay | Admitting: Gastroenterology

## 2023-02-12 ENCOUNTER — Encounter: Payer: Self-pay | Admitting: Certified Registered Nurse Anesthetist

## 2023-02-16 ENCOUNTER — Ambulatory Visit (AMBULATORY_SURGERY_CENTER): Payer: PPO | Admitting: Gastroenterology

## 2023-02-16 ENCOUNTER — Encounter: Payer: Self-pay | Admitting: Gastroenterology

## 2023-02-16 VITALS — BP 163/87 | HR 80 | Temp 97.3°F | Resp 16 | Ht 66.0 in | Wt 184.0 lb

## 2023-02-16 DIAGNOSIS — E119 Type 2 diabetes mellitus without complications: Secondary | ICD-10-CM | POA: Diagnosis not present

## 2023-02-16 DIAGNOSIS — Z09 Encounter for follow-up examination after completed treatment for conditions other than malignant neoplasm: Secondary | ICD-10-CM | POA: Diagnosis not present

## 2023-02-16 DIAGNOSIS — Z86010 Personal history of colonic polyps: Secondary | ICD-10-CM | POA: Diagnosis not present

## 2023-02-16 DIAGNOSIS — I10 Essential (primary) hypertension: Secondary | ICD-10-CM | POA: Diagnosis not present

## 2023-02-16 MED ORDER — SODIUM CHLORIDE 0.9 % IV SOLN
500.0000 mL | Freq: Once | INTRAVENOUS | Status: DC
Start: 2023-02-16 — End: 2023-02-16

## 2023-02-16 NOTE — Op Note (Signed)
[facility] Patient Name: Mary Colon Procedure Date: 02/16/2023 1:24 PM MRN: [id number] Endoscopist: Lynann Bologna , MD, [id number] Age: 72 Referring MD:  Date of Birth: [birth year] Gender: Female Account #: [id number] Procedure:                Colonoscopy Indications:              High risk colon cancer surveillance: Personal                            history of colonic polyps Medicines:                Monitored Anesthesia Care Procedure:                Pre-Anesthesia Assessment:                           - Prior to the procedure, a History and Physical                            was performed, and patient medications and                            allergies were reviewed. The patient's tolerance of                            previous anesthesia was also reviewed. The risks                            and benefits of the procedure and the sedation                            options and risks were discussed with the patient.                            All questions were answered, and informed consent                            was obtained. Prior Anticoagulants: The patient has                            taken no anticoagulant or antiplatelet agents. ASA                            Grade Assessment: II - A patient with mild systemic                            disease. After reviewing the risks and benefits,                            the patient was deemed in satisfactory condition to                            undergo the procedure.  After obtaining informed consent, the colonoscope                            was passed under direct vision. Throughout the                            procedure, the patient's blood pressure, pulse, and                            oxygen saturations were monitored continuously. The                            Olympus PCF-[id number] (#[id number]) Colonoscope was                            introduced through the anus and advanced to  the 2                            cm into the ileum. The colonoscopy was performed                            without difficulty. The colon was redundant and                            tortuous. Passage of scope was assisted by                            abdominal pressure. The patient tolerated the                            procedure well. The quality of the bowel                            preparation was good except in the right colon                            where there was adherent stool which could not be                            fully washed despite aggressive suctioning and                            aspiration. Overall the examination was adequate.                            The terminal ileum, ileocecal valve, appendiceal                            orifice, and rectum were photographed. Scope In: 1:36:29 PM Scope Out: 1:50:18 PM Scope Withdrawal Time: 0 hours 8 minutes 9 seconds  Total Procedure Duration: 0 hours 13 minutes 49 seconds  Findings:                 Multiple medium-mouthed diverticula were found in  the sigmoid colon. This would give sigmoid colon a                            "Swiss cheese appearance". There was luminal                            narrowing consistent with muscular hypertrophy. No                            endoscopic evidence of diverticulitis. There were                            rare diverticula visualized in the right colon.                           Non-bleeding internal hemorrhoids were found during                            retroflexion. The hemorrhoids were mild and Grade I                            (internal hemorrhoids that do not prolapse).                           The terminal ileum appeared normal.                           The exam was otherwise without abnormality on                            direct and retroflexion views. Complications:            No immediate complications. Estimated Blood Loss:      Estimated blood loss: none. Impression:               - Mod sigmoid diverticulosis                           - Non-bleeding internal hemorrhoids.                           - The examined portion of the ileum was normal.                           - The examination was otherwise normal on direct                            and retroflexion views.                           - No specimens collected. Recommendation:           - Patient has a contact number available for                            emergencies. The signs and symptoms of potential  delayed complications were discussed with the                            patient. Return to normal activities tomorrow.                            Written discharge instructions were provided to the                            patient.                           - High fiber diet.                           - Continue present medications.                           - Repeat colonoscopy is not recommended for                            screening purposes.                           - The findings and recommendations were discussed                            with the patient's family. Lynann Bologna, MD 02/16/2023 1:54:45 PM This report has been signed electronically.

## 2023-02-16 NOTE — Patient Instructions (Signed)

## 2023-02-16 NOTE — Progress Notes (Signed)
[facility] History and Physical   Primary Care Physician:  Buckner Malta, MD   Reason for Procedure:   H/o   Polyps  Plan:    colon     HPI: Mary Colon is a 72 y.o. female    Past Medical History:  Diagnosis Date   Abnormal colonoscopy 02/22/2015   Allergy    Diabetes mellitus without complication (H[facility])    Diabetic eye exam ([facility]) 10/10/2015   no retinopathy   H/O mammogram 12/01/2015   normal   History of pneumonia    History of UTI    Hypertension    Microalbuminuria absent 03/20/2017    Past Surgical History:  Procedure Laterality Date   Arm surgery Left 2004   BUNIONECTOMY Left 2014   CHOLECYSTECTOMY  10/2020   COLONOSCOPY  02/22/2015   Colonic polyp status post polypectomy. Moderate predominantly sigmoid diverticulosis. Tubular adenoma.    ESOPHAGOGASTRODUODENOSCOPY  09/16/2014   Mild gastrtis.     Prior to Admission medications   Medication Sig Start Date End Date Taking? Authorizing Provider  Continuous Glucose Sensor (FREESTYLE LIBRE 2 SENSOR) MISC USE TO CHECK BLOOD SUGAR CONTINUOUSLY . REPLACE EVERY 14 DAYS 01/08/23  Yes [provider]  levocetirizine (XYZAL) 5 MG tablet Take 5 mg by mouth at bedtime.   Yes [provider]  OZEMPIC, 1 MG/DOSE, 4 MG/3ML SOPN Inject 1 mg into the skin once a week.   Yes [provider]  pantoprazole (PROTONIX) 40 MG tablet TAKE 1 TABLET(40 MG) BY MOUTH DAILY 11/22/22  Yes Lynann Bologna, MD  FREESTYLE LITE test strip USE TO TEST BLOOD SUGAR BID Patient not taking: Reported on 01/19/2023 11/23/15   [provider]  loratadine (CLARITIN) 10 MG tablet loratadine 10 mg tablet  TK 1 T PO D Patient not taking: Reported on 02/16/2023    [provider]  sennosides-docusate sodium (SENOKOT-S) 8.6-50 MG tablet Take 1 tablet by mouth daily. Patient not taking: Reported on 01/19/2023    [provider]    Current Outpatient Medications  Medication Sig Dispense Refill    Continuous Glucose Sensor (FREESTYLE LIBRE 2 SENSOR) MISC USE TO CHECK BLOOD SUGAR CONTINUOUSLY . REPLACE EVERY 14 DAYS     levocetirizine (XYZAL) 5 MG tablet Take 5 mg by mouth at bedtime.     OZEMPIC, 1 MG/DOSE, 4 MG/3ML SOPN Inject 1 mg into the skin once a week.     pantoprazole (PROTONIX) 40 MG tablet TAKE 1 TABLET(40 MG) BY MOUTH DAILY 90 tablet 2   FREESTYLE LITE test strip USE TO TEST BLOOD SUGAR BID (Patient not taking: Reported on 01/19/2023)  3   loratadine (CLARITIN) 10 MG tablet loratadine 10 mg tablet  TK 1 T PO D (Patient not taking: Reported on 02/16/2023)     sennosides-docusate sodium (SENOKOT-S) 8.6-50 MG tablet Take 1 tablet by mouth daily. (Patient not taking: Reported on 01/19/2023)     Current Facility-Administered Medications  Medication Dose Route Frequency Provider Last Rate Last Admin   0.9 %  sodium chloride infusion  500 mL Intravenous Once Lynann Bologna, MD       0.9 %  sodium chloride infusion  500 mL Intravenous Once Lynann Bologna, MD        Allergies as of 02/16/2023 - Review Complete 02/16/2023  Allergen Reaction Noted   Codeine Nausea And Vomiting 02/23/2016   Hydrocodone Nausea And Vomiting 11/20/2017   Oxycodone Nausea And Vomiting 11/20/2017   Azithromycin Rash 01/09/2017   Methylprednisolone Rash 02/25/2016  Family History  Problem Relation Age of Onset   Heart disease Mother    Diabetes Mother    Heart disease Father    Cancer Sister        myoxid lipo sarcoma   Stomach cancer Paternal Grandmother    Colon cancer Neg Hx    Colon polyps Neg Hx    Esophageal cancer Neg Hx    Rectal cancer Neg Hx     Social History   Socioeconomic History   Marital status: Married    Spouse name: Not on file   Number of children: Not on file   Years of education: Not on file   Highest education level: Not on file  Occupational History   Not on file  Tobacco Use   Smoking status: Never   Smokeless tobacco: Never  Vaping Use   Vaping status: Never  Used  Substance and Sexual Activity   Alcohol use: Never   Drug use: Never   Sexual activity: Not on file  Other Topics Concern   Not on file  Social History Narrative   Not on file   Social Determinants of Health   Financial Resource Strain: Not on file  Food Insecurity: Not on file  Transportation Needs: Not on file  Physical Activity: Not on file  Stress: Not on file  Social Connections: Not on file  Intimate Partner Violence: Not on file    Review of Systems: Positive for none All other review of systems negative except as mentioned in the HPI.  Physical Exam: Vital signs in last 24 hours: @VSRANGES @   General:   Alert,  Well-developed, well-nourished, pleasant and cooperative in NAD Lungs:  Clear throughout to auscultation.   Heart:  Regular rate and rhythm; no murmurs, clicks, rubs,  or gallops. Abdomen:  Soft, nontender and nondistended. Normal bowel sounds.   Neuro/Psych:  Alert and cooperative. Normal mood and affect. A and O x 3    No significant changes were identified.  The patient continues to be an appropriate candidate for the planned procedure and anesthesia.   Edman Circle, MD. [facility] 02/16/2023 1:33 PM@

## 2023-02-16 NOTE — Progress Notes (Signed)
Pt's states no medical or surgical changes since previsit or office visit. 

## 2023-02-19 ENCOUNTER — Telehealth: Payer: Self-pay

## 2023-02-19 NOTE — Telephone Encounter (Signed)
  Follow up Call-     02/16/2023   12:50 PM  Call back number  Post procedure Call Back phone  # [phone]  Permission to leave phone message Yes     Patient questions:  Do you have a fever, pain , or abdominal swelling? No. Pain Score  0 *  Have you tolerated food without any problems? Yes.    Have you been able to return to your normal activities? Yes.    Do you have any questions about your discharge instructions: Diet   No. Medications  No. Follow up visit  No.  Do you have questions or concerns about your Care? No.  Actions: * If pain score is 4 or above: No action needed, pain <4.

## 2023-03-23 DIAGNOSIS — I700 Atherosclerosis of aorta: Secondary | ICD-10-CM | POA: Diagnosis not present

## 2023-03-23 DIAGNOSIS — R0982 Postnasal drip: Secondary | ICD-10-CM | POA: Diagnosis not present

## 2023-03-23 DIAGNOSIS — L84 Corns and callosities: Secondary | ICD-10-CM | POA: Diagnosis not present

## 2023-03-23 DIAGNOSIS — M5432 Sciatica, left side: Secondary | ICD-10-CM | POA: Diagnosis not present

## 2023-03-23 DIAGNOSIS — E785 Hyperlipidemia, unspecified: Secondary | ICD-10-CM | POA: Diagnosis not present

## 2023-03-23 DIAGNOSIS — J309 Allergic rhinitis, unspecified: Secondary | ICD-10-CM | POA: Diagnosis not present

## 2023-03-23 DIAGNOSIS — E113591 Type 2 diabetes mellitus with proliferative diabetic retinopathy without macular edema, right eye: Secondary | ICD-10-CM | POA: Diagnosis not present

## 2023-03-23 DIAGNOSIS — E1169 Type 2 diabetes mellitus with other specified complication: Secondary | ICD-10-CM | POA: Diagnosis not present

## 2023-03-23 DIAGNOSIS — R809 Proteinuria, unspecified: Secondary | ICD-10-CM | POA: Diagnosis not present

## 2023-03-23 DIAGNOSIS — E1129 Type 2 diabetes mellitus with other diabetic kidney complication: Secondary | ICD-10-CM | POA: Diagnosis not present

## 2023-03-23 DIAGNOSIS — E11628 Type 2 diabetes mellitus with other skin complications: Secondary | ICD-10-CM | POA: Diagnosis not present

## 2023-03-23 DIAGNOSIS — E119 Type 2 diabetes mellitus without complications: Secondary | ICD-10-CM | POA: Diagnosis not present

## 2023-03-27 DIAGNOSIS — L578 Other skin changes due to chronic exposure to nonionizing radiation: Secondary | ICD-10-CM | POA: Diagnosis not present

## 2023-03-27 DIAGNOSIS — B079 Viral wart, unspecified: Secondary | ICD-10-CM | POA: Diagnosis not present

## 2023-03-27 DIAGNOSIS — L821 Other seborrheic keratosis: Secondary | ICD-10-CM | POA: Diagnosis not present

## 2023-06-19 DIAGNOSIS — B079 Viral wart, unspecified: Secondary | ICD-10-CM | POA: Diagnosis not present

## 2023-06-22 DIAGNOSIS — B3731 Acute candidiasis of vulva and vagina: Secondary | ICD-10-CM | POA: Diagnosis not present

## 2023-06-22 DIAGNOSIS — M5442 Lumbago with sciatica, left side: Secondary | ICD-10-CM | POA: Diagnosis not present

## 2023-06-22 DIAGNOSIS — E1129 Type 2 diabetes mellitus with other diabetic kidney complication: Secondary | ICD-10-CM | POA: Diagnosis not present

## 2023-06-22 DIAGNOSIS — E113591 Type 2 diabetes mellitus with proliferative diabetic retinopathy without macular edema, right eye: Secondary | ICD-10-CM | POA: Diagnosis not present

## 2023-06-22 DIAGNOSIS — R809 Proteinuria, unspecified: Secondary | ICD-10-CM | POA: Diagnosis not present

## 2023-06-22 DIAGNOSIS — L84 Corns and callosities: Secondary | ICD-10-CM | POA: Diagnosis not present

## 2023-06-22 DIAGNOSIS — E1169 Type 2 diabetes mellitus with other specified complication: Secondary | ICD-10-CM | POA: Diagnosis not present

## 2023-06-22 DIAGNOSIS — E119 Type 2 diabetes mellitus without complications: Secondary | ICD-10-CM | POA: Diagnosis not present

## 2023-06-22 DIAGNOSIS — M25562 Pain in left knee: Secondary | ICD-10-CM | POA: Diagnosis not present

## 2023-06-22 DIAGNOSIS — E785 Hyperlipidemia, unspecified: Secondary | ICD-10-CM | POA: Diagnosis not present

## 2023-06-22 DIAGNOSIS — I700 Atherosclerosis of aorta: Secondary | ICD-10-CM | POA: Diagnosis not present

## 2023-06-22 DIAGNOSIS — E11628 Type 2 diabetes mellitus with other skin complications: Secondary | ICD-10-CM | POA: Diagnosis not present

## 2023-06-30 DIAGNOSIS — I83819 Varicose veins of unspecified lower extremities with pain: Secondary | ICD-10-CM | POA: Diagnosis not present

## 2023-08-03 ENCOUNTER — Other Ambulatory Visit: Payer: Self-pay

## 2023-08-03 DIAGNOSIS — I872 Venous insufficiency (chronic) (peripheral): Secondary | ICD-10-CM

## 2023-08-08 ENCOUNTER — Other Ambulatory Visit: Payer: Self-pay | Admitting: Gastroenterology

## 2023-08-16 ENCOUNTER — Encounter: Payer: PPO | Admitting: Vascular Surgery

## 2023-08-16 ENCOUNTER — Ambulatory Visit (HOSPITAL_COMMUNITY): Payer: PPO

## 2023-09-11 DIAGNOSIS — B079 Viral wart, unspecified: Secondary | ICD-10-CM | POA: Diagnosis not present

## 2023-10-04 ENCOUNTER — Encounter: Payer: Self-pay | Admitting: Radiology

## 2023-10-19 ENCOUNTER — Ambulatory Visit (HOSPITAL_COMMUNITY)

## 2023-10-19 ENCOUNTER — Ambulatory Visit

## 2023-10-19 DIAGNOSIS — J309 Allergic rhinitis, unspecified: Secondary | ICD-10-CM | POA: Diagnosis not present

## 2023-10-19 DIAGNOSIS — E113591 Type 2 diabetes mellitus with proliferative diabetic retinopathy without macular edema, right eye: Secondary | ICD-10-CM | POA: Diagnosis not present

## 2023-10-19 DIAGNOSIS — E785 Hyperlipidemia, unspecified: Secondary | ICD-10-CM | POA: Diagnosis not present

## 2023-10-19 DIAGNOSIS — E1129 Type 2 diabetes mellitus with other diabetic kidney complication: Secondary | ICD-10-CM | POA: Diagnosis not present

## 2023-10-19 DIAGNOSIS — E1169 Type 2 diabetes mellitus with other specified complication: Secondary | ICD-10-CM | POA: Diagnosis not present

## 2023-10-19 DIAGNOSIS — Z6830 Body mass index (BMI) 30.0-30.9, adult: Secondary | ICD-10-CM | POA: Diagnosis not present

## 2023-10-19 DIAGNOSIS — R0982 Postnasal drip: Secondary | ICD-10-CM | POA: Diagnosis not present

## 2023-10-19 DIAGNOSIS — R809 Proteinuria, unspecified: Secondary | ICD-10-CM | POA: Diagnosis not present

## 2023-10-23 DIAGNOSIS — B070 Plantar wart: Secondary | ICD-10-CM | POA: Diagnosis not present

## 2023-12-15 DIAGNOSIS — T50905A Adverse effect of unspecified drugs, medicaments and biological substances, initial encounter: Secondary | ICD-10-CM | POA: Diagnosis not present

## 2023-12-15 DIAGNOSIS — R110 Nausea: Secondary | ICD-10-CM | POA: Diagnosis not present

## 2023-12-15 DIAGNOSIS — R1013 Epigastric pain: Secondary | ICD-10-CM | POA: Diagnosis not present

## 2023-12-27 ENCOUNTER — Other Ambulatory Visit: Payer: Self-pay | Admitting: Vascular Surgery

## 2023-12-27 DIAGNOSIS — I872 Venous insufficiency (chronic) (peripheral): Secondary | ICD-10-CM

## 2023-12-28 DIAGNOSIS — E119 Type 2 diabetes mellitus without complications: Secondary | ICD-10-CM | POA: Diagnosis not present

## 2023-12-28 DIAGNOSIS — E669 Obesity, unspecified: Secondary | ICD-10-CM | POA: Diagnosis not present

## 2023-12-28 DIAGNOSIS — I700 Atherosclerosis of aorta: Secondary | ICD-10-CM | POA: Diagnosis not present

## 2023-12-28 DIAGNOSIS — K219 Gastro-esophageal reflux disease without esophagitis: Secondary | ICD-10-CM | POA: Diagnosis not present

## 2023-12-28 DIAGNOSIS — J309 Allergic rhinitis, unspecified: Secondary | ICD-10-CM | POA: Diagnosis not present

## 2024-01-02 DIAGNOSIS — H3530 Unspecified macular degeneration: Secondary | ICD-10-CM | POA: Diagnosis not present

## 2024-01-02 DIAGNOSIS — H5203 Hypermetropia, bilateral: Secondary | ICD-10-CM | POA: Diagnosis not present

## 2024-01-02 DIAGNOSIS — Z7985 Long-term (current) use of injectable non-insulin antidiabetic drugs: Secondary | ICD-10-CM | POA: Diagnosis not present

## 2024-01-02 DIAGNOSIS — H52223 Regular astigmatism, bilateral: Secondary | ICD-10-CM | POA: Diagnosis not present

## 2024-01-02 DIAGNOSIS — E119 Type 2 diabetes mellitus without complications: Secondary | ICD-10-CM | POA: Diagnosis not present

## 2024-01-02 DIAGNOSIS — H353131 Nonexudative age-related macular degeneration, bilateral, early dry stage: Secondary | ICD-10-CM | POA: Diagnosis not present

## 2024-01-02 DIAGNOSIS — H524 Presbyopia: Secondary | ICD-10-CM | POA: Diagnosis not present

## 2024-01-02 DIAGNOSIS — H25813 Combined forms of age-related cataract, bilateral: Secondary | ICD-10-CM | POA: Diagnosis not present

## 2024-01-04 ENCOUNTER — Encounter: Admitting: Vascular Surgery

## 2024-01-04 ENCOUNTER — Encounter (HOSPITAL_COMMUNITY)

## 2024-01-10 NOTE — Progress Notes (Unsigned)
 Patient ID: Mary Colon, female   DOB: [birth year], 73 y.o.   MRN: [id number]  Reason for Consult: No chief complaint on file.   Referred by Fernand Tracey LABOR, MD  Subjective:     HPI  Mary Colon is a 73 y.o. female who presents for evaluation of lower extremity *** Timeframe: *** Symptoms: *** Varicosities: *** Previous wounds: *** Previous DVT: *** In compression: ***  Past Medical History:  Diagnosis Date   Abnormal colonoscopy 02/22/2015   Allergy    Diabetes mellitus without complication (HCC)    Diabetic eye exam ([facility]CC) 10/10/2015   no retinopathy   H/O mammogram 12/01/2015   normal   History of pneumonia    History of UTI    Hypertension    Microalbuminuria absent 03/20/2017   Family History  Problem Relation Age of Onset   Heart disease Mother    Diabetes Mother    Heart disease Father    Cancer Sister        myoxid lipo sarcoma   Stomach cancer Paternal Grandmother    Colon cancer Neg Hx    Colon polyps Neg Hx    Esophageal cancer Neg Hx    Rectal cancer Neg Hx    Past Surgical History:  Procedure Laterality Date   Arm surgery Left 2004   BUNIONECTOMY Left 2014   CHOLECYSTECTOMY  10/2020   COLONOSCOPY  02/22/2015   Colonic polyp status post polypectomy. Moderate predominantly sigmoid diverticulosis. Tubular adenoma.    ESOPHAGOGASTRODUODENOSCOPY  09/16/2014   Mild gastrtis.     Short Social History:  Social History   Tobacco Use   Smoking status: Never   Smokeless tobacco: Never  Substance Use Topics   Alcohol use: Never    Allergies  Allergen Reactions   Codeine Nausea And Vomiting   Hydrocodone Nausea And Vomiting   Oxycodone  Nausea And Vomiting   Azithromycin Rash   Methylprednisolone  Rash    Current Outpatient Medications  Medication Sig Dispense Refill   Continuous Glucose Sensor (FREESTYLE LIBRE 2 SENSOR) MISC USE TO CHECK BLOOD SUGAR CONTINUOUSLY . REPLACE EVERY 14 DAYS     FREESTYLE LITE test strip USE TO TEST BLOOD SUGAR BID  (Patient not taking: Reported on 01/19/2023)  3   levocetirizine (XYZAL) 5 MG tablet Take 5 mg by mouth at bedtime.     loratadine (CLARITIN) 10 MG tablet loratadine 10 mg tablet  TK 1 T PO D (Patient not taking: Reported on 02/16/2023)     OZEMPIC, 1 MG/DOSE, 4 MG/3ML SOPN Inject 1 mg into the skin once a week.     pantoprazole  (PROTONIX ) 40 MG tablet TAKE 1 TABLET(40 MG) BY MOUTH DAILY 90 tablet 1   sennosides-docusate sodium (SENOKOT-S) 8.6-50 MG tablet Take 1 tablet by mouth daily. (Patient not taking: Reported on 01/19/2023)     No current facility-administered medications for this visit.    REVIEW OF SYSTEMS All other systems were reviewed and are negative    Objective:  Objective   There were no vitals filed for this visit. There is no height or weight on file to calculate BMI.  Physical Exam General: no acute distress Cardiac: hemodynamically stable Pulm: normal work of breathing Neuro: alert, no focal deficit Extremities: *** Vascular:   Right: palpable DP, PT***  Left: palpable DP, PT***   Data: Reflux study ***      Assessment/Plan:     Mary Colon is a 73 y.o. female with chronic venous insufficiency with C*** disease and reflux  noted in *** I explained the foundation of CVI treatment of compression and elevation I recommended medical grade graduated compression stockings and intermittent leg elevation We also discussed that many patients find symptom improvement with exercise and if they have access to a pool should attempt water aerobics.  Plan to follow up in 3 months with Dr. Serene or Dr. Sheree with a *** reflux study in order to complete bilateral imaging      Norman GORMAN Serve MD [facility]

## 2024-01-11 ENCOUNTER — Ambulatory Visit (HOSPITAL_COMMUNITY)
Admission: RE | Admit: 2024-01-11 | Discharge: 2024-01-11 | Disposition: A | Discharge status: Home / Self Care | Source: Ambulatory Visit | Attending: Vascular Surgery | Admitting: Vascular Surgery

## 2024-01-11 ENCOUNTER — Encounter: Payer: Self-pay | Admitting: Vascular Surgery

## 2024-01-11 ENCOUNTER — Ambulatory Visit: Attending: Vascular Surgery | Admitting: Vascular Surgery

## 2024-01-11 VITALS — BP 163/101 | HR 70 | Temp 97.8°F | Ht 66.0 in | Wt 186.0 lb

## 2024-01-11 DIAGNOSIS — I872 Venous insufficiency (chronic) (peripheral): Secondary | ICD-10-CM

## 2024-01-18 DIAGNOSIS — E785 Hyperlipidemia, unspecified: Secondary | ICD-10-CM | POA: Diagnosis not present

## 2024-01-18 DIAGNOSIS — Z1331 Encounter for screening for depression: Secondary | ICD-10-CM | POA: Diagnosis not present

## 2024-01-18 DIAGNOSIS — E119 Type 2 diabetes mellitus without complications: Secondary | ICD-10-CM | POA: Diagnosis not present

## 2024-01-18 DIAGNOSIS — Z0000 Encounter for general adult medical examination without abnormal findings: Secondary | ICD-10-CM | POA: Diagnosis not present

## 2024-01-18 DIAGNOSIS — E1129 Type 2 diabetes mellitus with other diabetic kidney complication: Secondary | ICD-10-CM | POA: Diagnosis not present

## 2024-01-18 DIAGNOSIS — Z6830 Body mass index (BMI) 30.0-30.9, adult: Secondary | ICD-10-CM | POA: Diagnosis not present

## 2024-01-18 DIAGNOSIS — E113591 Type 2 diabetes mellitus with proliferative diabetic retinopathy without macular edema, right eye: Secondary | ICD-10-CM | POA: Diagnosis not present

## 2024-01-18 DIAGNOSIS — E1169 Type 2 diabetes mellitus with other specified complication: Secondary | ICD-10-CM | POA: Diagnosis not present

## 2024-01-18 DIAGNOSIS — R809 Proteinuria, unspecified: Secondary | ICD-10-CM | POA: Diagnosis not present

## 2024-01-28 DIAGNOSIS — R0981 Nasal congestion: Secondary | ICD-10-CM | POA: Diagnosis not present

## 2024-01-28 DIAGNOSIS — Z6830 Body mass index (BMI) 30.0-30.9, adult: Secondary | ICD-10-CM | POA: Diagnosis not present

## 2024-04-04 DIAGNOSIS — Z1231 Encounter for screening mammogram for malignant neoplasm of breast: Secondary | ICD-10-CM | POA: Diagnosis not present

## 2024-04-06 DIAGNOSIS — Z1231 Encounter for screening mammogram for malignant neoplasm of breast: Secondary | ICD-10-CM | POA: Diagnosis not present

## 2024-06-14 ENCOUNTER — Other Ambulatory Visit: Payer: Self-pay | Admitting: Gastroenterology

## 2024-06-17 NOTE — Progress Notes (Signed)
 Mary Colon                                          MRN: [id number]   06/17/2024   The [facility] Quality Team Specialist reviewed this patient medical record for the purposes of chart review for care gap closure. The following were reviewed: chart review for care gap closure-glycemic status assessment.    V[facility]I Quality Team
